# Patient Record
Sex: Female | Born: 1965 | Hispanic: No | Marital: Married | Smoking: Light tobacco smoker
Health system: Southern US, Community
[De-identification: ages and names within clinical notes are randomized; demographics above are authoritative.]

## PROBLEM LIST (undated history)

## (undated) DIAGNOSIS — K8020 Calculus of gallbladder without cholecystitis without obstruction: Secondary | ICD-10-CM

## (undated) HISTORY — PX: IUD REMOVAL: SHX5392

## (undated) HISTORY — PX: INTRAUTERINE DEVICE INSERTION: SHX323

## (undated) HISTORY — DX: Calculus of gallbladder without cholecystitis without obstruction: K80.20

---

## 1998-08-29 ENCOUNTER — Encounter: Admission: RE | Admit: 1998-08-29 | Discharge: 1998-08-29 | Payer: Self-pay | Admitting: *Deleted

## 2006-07-27 ENCOUNTER — Other Ambulatory Visit: Admission: RE | Admit: 2006-07-27 | Discharge: 2006-07-27 | Payer: Self-pay | Admitting: Obstetrics & Gynecology

## 2006-07-30 ENCOUNTER — Ambulatory Visit (HOSPITAL_COMMUNITY): Admission: RE | Admit: 2006-07-30 | Discharge: 2006-07-30 | Payer: Self-pay | Admitting: Obstetrics & Gynecology

## 2007-08-03 ENCOUNTER — Ambulatory Visit (HOSPITAL_COMMUNITY): Admission: RE | Admit: 2007-08-03 | Discharge: 2007-08-03 | Payer: Self-pay | Admitting: Obstetrics & Gynecology

## 2007-12-24 ENCOUNTER — Other Ambulatory Visit: Admission: RE | Admit: 2007-12-24 | Discharge: 2007-12-24 | Payer: Self-pay | Admitting: Obstetrics & Gynecology

## 2008-08-02 ENCOUNTER — Ambulatory Visit (HOSPITAL_COMMUNITY): Admission: RE | Admit: 2008-08-02 | Discharge: 2008-08-02 | Payer: Self-pay | Admitting: Obstetrics & Gynecology

## 2008-08-12 ENCOUNTER — Observation Stay (HOSPITAL_COMMUNITY): Admission: EM | Admit: 2008-08-12 | Discharge: 2008-08-14 | Payer: Self-pay | Admitting: Emergency Medicine

## 2008-12-29 ENCOUNTER — Other Ambulatory Visit: Admission: RE | Admit: 2008-12-29 | Discharge: 2008-12-29 | Payer: Self-pay | Admitting: Obstetrics & Gynecology

## 2008-12-30 ENCOUNTER — Inpatient Hospital Stay (HOSPITAL_COMMUNITY): Admission: EM | Admit: 2008-12-30 | Discharge: 2009-01-01 | Payer: Self-pay | Admitting: Emergency Medicine

## 2009-08-16 ENCOUNTER — Ambulatory Visit (HOSPITAL_COMMUNITY): Admission: RE | Admit: 2009-08-16 | Discharge: 2009-08-16 | Payer: Self-pay | Admitting: Obstetrics & Gynecology

## 2010-07-31 ENCOUNTER — Encounter: Admission: RE | Admit: 2010-07-31 | Discharge: 2010-07-31 | Payer: Self-pay | Admitting: Gastroenterology

## 2010-09-10 ENCOUNTER — Ambulatory Visit (HOSPITAL_COMMUNITY): Admission: RE | Admit: 2010-09-10 | Discharge: 2010-09-10 | Payer: Self-pay | Admitting: Obstetrics & Gynecology

## 2010-11-26 ENCOUNTER — Ambulatory Visit (HOSPITAL_COMMUNITY): Admission: RE | Admit: 2010-11-26 | Payer: Self-pay | Source: Home / Self Care | Admitting: Obstetrics & Gynecology

## 2011-01-15 HISTORY — PX: CHOLECYSTECTOMY: SHX55

## 2011-01-31 ENCOUNTER — Other Ambulatory Visit (HOSPITAL_COMMUNITY): Payer: BC Managed Care – PPO

## 2011-02-03 ENCOUNTER — Encounter (HOSPITAL_COMMUNITY)
Admission: RE | Admit: 2011-02-03 | Discharge: 2011-02-03 | Disposition: A | Discharge status: Home / Self Care | Payer: BC Managed Care – PPO | Source: Ambulatory Visit | Attending: General Surgery | Admitting: General Surgery

## 2011-02-03 DIAGNOSIS — Z01812 Encounter for preprocedural laboratory examination: Secondary | ICD-10-CM | POA: Insufficient documentation

## 2011-02-03 LAB — CBC
HCT: 44.5 % (ref 36.0–46.0)
MCH: 29.1 pg (ref 26.0–34.0)
MCHC: 33.3 g/dL (ref 30.0–36.0)
MCV: 87.6 fL (ref 78.0–100.0)
RDW: 13.6 % (ref 11.5–15.5)
WBC: 8.6 10*3/uL (ref 4.0–10.5)

## 2011-02-03 LAB — DIFFERENTIAL
Basophils Absolute: 0 10*3/uL (ref 0.0–0.1)
Basophils Relative: 0 % (ref 0–1)
Eosinophils Absolute: 0.2 10*3/uL (ref 0.0–0.7)
Eosinophils Relative: 2 % (ref 0–5)
Lymphocytes Relative: 33 % (ref 12–46)
Lymphs Abs: 2.9 10*3/uL (ref 0.7–4.0)
Monocytes Absolute: 0.4 10*3/uL (ref 0.1–1.0)
Monocytes Relative: 5 % (ref 3–12)
Neutro Abs: 5.2 10*3/uL (ref 1.7–7.7)
Neutrophils Relative %: 60 % (ref 43–77)

## 2011-02-03 LAB — COMPREHENSIVE METABOLIC PANEL
Albumin: 3.7 g/dL (ref 3.5–5.2)
BUN: 12 mg/dL (ref 6–23)
Potassium: 4.1 mEq/L (ref 3.5–5.1)
Sodium: 139 mEq/L (ref 135–145)

## 2011-02-03 LAB — SURGICAL PCR SCREEN: MRSA, PCR: NEGATIVE

## 2011-02-04 ENCOUNTER — Ambulatory Visit (HOSPITAL_COMMUNITY)
Admission: RE | Admit: 2011-02-04 | Discharge: 2011-02-04 | Disposition: A | Discharge status: Home / Self Care | Payer: BC Managed Care – PPO | Source: Ambulatory Visit | Attending: General Surgery | Admitting: General Surgery

## 2011-02-04 ENCOUNTER — Other Ambulatory Visit: Payer: Self-pay | Admitting: General Surgery

## 2011-02-04 DIAGNOSIS — Z01812 Encounter for preprocedural laboratory examination: Secondary | ICD-10-CM | POA: Insufficient documentation

## 2011-02-04 DIAGNOSIS — K8020 Calculus of gallbladder without cholecystitis without obstruction: Secondary | ICD-10-CM | POA: Insufficient documentation

## 2011-02-04 DIAGNOSIS — K824 Cholesterolosis of gallbladder: Secondary | ICD-10-CM | POA: Insufficient documentation

## 2011-02-09 NOTE — Op Note (Signed)
Felicia Gill, DRENNING NO.:  [id number]  MEDICAL RECORD NO.:  [id number]           PATIENT TYPE:  O  LOCATION:  SDSC                         FACILITY:  [facility]  PHYSICIAN:  Cherylynn Ridges, M.D.    DATE OF BIRTH:  1966-06-30  DATE OF PROCEDURE:  02/04/2011 DATE OF DISCHARGE:                              OPERATIVE REPORT   PREOPERATIVE DIAGNOSES:  Gallbladder polyps and symptomatic cholelithiasis.  POSTOPERATIVE DIAGNOSES:  Gallbladder polyps and symptomatic cholelithiasis.  PROCEDURE:  Laparoscopic cholecystectomy.  SURGEON:  Cherylynn Ridges, MD  ASSISTANT:  None.  ANESTHESIA:  General endotracheal.  ESTIMATED BLOOD LOSS:  Less than 20 mL.  COMPLICATIONS:  None.  CONDITION:  Stable.  FINDINGS:  Normal anatomy, minimal acute inflammation.  INDICATIONS FOR OPERATION:  The patient is a 46 year old with a prior history of symptomatic gallstones and also recently developed gallbladder polyps on ultrasound, who now comes in for laparoscopic cholecystectomy.  OPERATION:  The patient was taken to the operating room and placed on the table in supine position.  After an adequate general endotracheal anesthetic was administered, she was prepped and draped in usual sterile manner exposing the midline and the entire abdomen.  A supraumbilical midline incision was made using #15 blade, about 1.5 cm long and taken down to the midline fascia.  We isolated the fascia at that level and then made an incision longitudinally using #15 blade.  We grabbed the edges with Kocher clamps and bluntly dissected down into the peritoneal cavity with a Kelly clamp as we tented up using a Kocher clamps.  Once we were in the peritoneal cavity, a pursestring suture of 0 Vicryl was passed around the fascial opening.  We then passed a Hasson cannula into the peritoneal cavity with minimal difficulty securing it in place with a pursestring suture.  Once this was done, carbon dioxide  gas was insufflated into the peritoneal cavity up to maximal intra-abdominal pressure of 50 mmHg. With the patient in reverse Trendelenburg, two right upper quadrant 5-mm cannulas and a subxiphoid 5-mm cannula passed under direct vision.  Once all cannulas were in place, we began the dissection.  We retracted the gallbladder towards the anterior abdominal wall and the right upper quadrant placing a second retractor on the infundibulum.  We isolated the peritoneum overlying the triangle of Calot and hepatoduodenal triangle.  We dissected out circumferentially the cystic duct and the cystic artery.  Once this was done and there was no evidence of any problems with their anatomy, we clipped the cystic duct triply, distally and proximally x1 and transected it.  The cystic artery was clipped proximally, distally, doubly and transected.  The patient had normal preoperative liver function tests.  The gallbladder was then dissected out of its bed with minimal difficulty.  We retrieved it from the supraumbilical site using a large grasper with minimal difficulty, no EndoCatch bag was necessary.  Once this was done, we inspected the bed where electrocautery was used to control minimal bleeding, irrigated with saline solution and aspirated all fluid and gas from above the liver.  All needle counts, sponge  counts, and instrument counts at this time were correct.  We removed all cannulas tying off the fascia to supraumbilical site using a pursestring suture, which was in place.  We injected all sites with 0.25% Marcaine with epinephrine.  We closed the supraumbilical skin site using running subcuticular stitch of 4-0 Monocryl.  The other sites were closed using Dermabond, Steri-Strips, Tegaderm that was used also to complete the supraumbilical site.  All needle counts, sponge counts, and instrument counts were correct on the final count.  It should be mentioned that a proper time-out was done at  the beginning of the case, identifying the patient and the procedure to be performed prior to our first incision.     Cherylynn Ridges, M.D.     JOW/MEDQ  D:  02/04/2011  T:  02/04/2011  Job:  [id number]  cc:   Anselmo Rod, MD, [facility]  Electronically Signed by Jimmye Norman M.D. on 02/09/2011 07:33:20 AM

## 2011-03-31 LAB — DIFFERENTIAL
Basophils Absolute: 0 10*3/uL (ref 0.0–0.1)
Basophils Absolute: 0 K/uL (ref 0.0–0.1)
Basophils Relative: 0 % (ref 0–1)
Basophils Relative: 0 % (ref 0–1)
Eosinophils Absolute: 0 10*3/uL (ref 0.0–0.7)
Eosinophils Absolute: 0.3 K/uL (ref 0.0–0.7)
Eosinophils Relative: 0 % (ref 0–5)
Eosinophils Relative: 3 % (ref 0–5)
Lymphocytes Relative: 2 % — ABNORMAL LOW (ref 12–46)
Lymphocytes Relative: 31 % (ref 12–46)
Lymphs Abs: 0.4 10*3/uL — ABNORMAL LOW (ref 0.7–4.0)
Lymphs Abs: 3 K/uL (ref 0.7–4.0)
Monocytes Absolute: 0.2 10*3/uL (ref 0.1–1.0)
Monocytes Absolute: 0.7 10*3/uL (ref 0.1–1.0)
Monocytes Relative: 1 % — ABNORMAL LOW (ref 3–12)
Monocytes Relative: 7 % (ref 3–12)
Neutro Abs: 19.9 10*3/uL — ABNORMAL HIGH (ref 1.7–7.7)
Neutro Abs: 5.6 K/uL (ref 1.7–7.7)
Neutrophils Relative %: 59 % (ref 43–77)
Neutrophils Relative %: 97 % — ABNORMAL HIGH (ref 43–77)

## 2011-03-31 LAB — COMPREHENSIVE METABOLIC PANEL
ALT: 17 U/L (ref 0–35)
AST: 22 U/L (ref 0–37)
Alkaline Phosphatase: 75 U/L (ref 39–117)
CO2: 22 mEq/L (ref 19–32)
Chloride: 102 mEq/L (ref 96–112)
Creatinine, Ser: 1.04 mg/dL (ref 0.4–1.2)
GFR calc Af Amer: >60 mL/min (ref >60)
GFR calc non Af Amer: 58 mL/min — ABNORMAL LOW (ref >60)
Potassium: 3.5 mEq/L (ref 3.5–5.1)
Sodium: 136 mEq/L (ref 135–145)
Total Bilirubin: 0.9 mg/dL (ref 0.3–1.2)

## 2011-03-31 LAB — CBC
HCT: 27.6 % — ABNORMAL LOW (ref 36.0–46.0)
HCT: 38.6 % (ref 36.0–46.0)
HCT: 40.1 % (ref 36.0–46.0)
Hemoglobin: 12.7 g/dL (ref 12.0–15.0)
Hemoglobin: 13.3 g/dL (ref 12.0–15.0)
Hemoglobin: 9 g/dL — ABNORMAL LOW (ref 12.0–15.0)
MCHC: 32.8 g/dL (ref 30.0–36.0)
MCHC: 32.8 g/dL (ref 30.0–36.0)
MCHC: 33.1 g/dL (ref 30.0–36.0)
MCV: 85.3 fL (ref 78.0–100.0)
MCV: 88.7 fL (ref 78.0–100.0)
MCV: 91.1 fL (ref 78.0–100.0)
Platelets: 171 10*3/uL (ref 150–400)
Platelets: 177 10*3/uL (ref 150–400)
Platelets: 208 10*3/uL (ref 150–400)
Platelets: 315 10*3/uL (ref 150–400)
RBC: 3.23 MIL/uL — ABNORMAL LOW (ref 3.87–5.11)
RBC: 4.52 MIL/uL (ref 3.87–5.11)
RDW: 13.4 % (ref 11.5–15.5)
RDW: 13.6 % (ref 11.5–15.5)
RDW: 13.7 % (ref 11.5–15.5)
RDW: 17.3 % — ABNORMAL HIGH (ref 11.5–15.5)
WBC: 14.1 10*3/uL — ABNORMAL HIGH (ref 4.0–10.5)
WBC: 20.5 K/uL — ABNORMAL HIGH (ref 4.0–10.5)
WBC: 9.5 K/uL (ref 4.0–10.5)

## 2011-03-31 LAB — LIPID PANEL
Cholesterol: 129 mg/dL (ref 0–200)
HDL: 42 mg/dL (ref >39)
LDL Cholesterol: 80 mg/dL (ref 0–99)
Total CHOL/HDL Ratio: 3.1 RATIO
Triglycerides: 37 mg/dL (ref <150)
VLDL: 7 mg/dL (ref 0–40)

## 2011-03-31 LAB — BASIC METABOLIC PANEL
BUN: 6 mg/dL (ref 6–23)
BUN: 8 mg/dL (ref 6–23)
CO2: 26 mEq/L (ref 19–32)
Creatinine, Ser: 0.9 mg/dL (ref 0.4–1.2)
GFR calc non Af Amer: >60 mL/min (ref >60)
GFR calc non Af Amer: >60 mL/min (ref >60)
Glucose, Bld: 129 mg/dL — ABNORMAL HIGH (ref 70–99)
Glucose, Bld: 85 mg/dL (ref 70–99)
Potassium: 4 mEq/L (ref 3.5–5.1)

## 2011-03-31 LAB — URINALYSIS, ROUTINE W REFLEX MICROSCOPIC
Bilirubin Urine: NEGATIVE
Glucose, UA: NEGATIVE mg/dL
Ketones, ur: NEGATIVE mg/dL
Nitrite: NEGATIVE
Protein, ur: NEGATIVE mg/dL
Specific Gravity, Urine: 1.011 (ref 1.005–1.030)
Urobilinogen, UA: 0.2 mg/dL (ref 0.0–1.0)
pH: 6 (ref 5.0–8.0)

## 2011-03-31 LAB — URINE MICROSCOPIC-ADD ON

## 2011-03-31 LAB — POCT PREGNANCY, URINE: Preg Test, Ur: NEGATIVE

## 2011-03-31 LAB — CULTURE, BLOOD (ROUTINE X 2)
Culture: NO GROWTH
Culture: NO GROWTH

## 2011-03-31 LAB — URINE CULTURE
Colony Count: NO GROWTH
Culture: NO GROWTH

## 2011-04-29 NOTE — Discharge Summary (Signed)
NAMEJEFFIFER, Felicia Gill NO.:  [id number]   MEDICAL RECORD NO.:  [id number]          PATIENT TYPE:  INP   LOCATION:  1225                         FACILITY:  [facility]   PHYSICIAN:  Theodosia Paling, MD    DATE OF BIRTH:  August 29, 1966   DATE OF ADMISSION:  12/30/2008  DATE OF DISCHARGE:  01/01/2009                               DISCHARGE SUMMARY   Admitting history, please refer to the excellent admission note dictated  by Dr. Della Goo.   PRIMARY CARE PHYSICIAN:  The patient is unassigned.   DISCHARGE DIAGNOSES:  1. Most likely allergic reaction to medication Bactrim.  2. Transient hypertension.  3. Folliculitis.   DISCHARGE MEDICATIONS:  None.   HOSPITAL COURSE:  The following issues were addressed during the  hospitalization:   1. Allergic reaction to BACTRIM according to the patient.  As an      outpatient she took 1 dose of BACTRIM, and within a couple of hours      she had fever, chills, nausea, vomiting, back pains, myalgia, and      headache.  Later on she presented to the ER where she was found to      have leukocytosis, and she was admitted for possible MRSA      infection.  However, the symptoms appeared more likely secondary to      BACTRIM allergy than anything.  The patient's symptoms      significantly improved within 24 hours after stopping the drug.  2. Transient hypertension.  The patient had transient hypertension.      She received IV fluids along with dopamine. Within 12 hours her      hypertension resolved.  At the time of discharge she was      hemodynamically stable.  3. Folliculitis.  The patient and her husband have history of      folliculitis and boils so at the time of admission and discharge      the patient had evidence of folliculitis which has healed already.      At this time she has no fever or evidence of skin infection with      Staphylococcus.  However, she and her husband may be the carriers      of that.  Therefore as  an outpatient she was going to get the      nasal swab.  If both she and her husband are positive or even if      she is positive for that, she and her husband will need MRSA      decontamination with chlorhexidine body wash, Mupirocin nasal      ointment, and rifampin or doxycycline for 6-7 days.   CONSULTATIONS PERFORMED:  None.   PROCEDURE PERFORMED:  None.   IMAGING PERFORMED:  Chest x-ray done on 16th of January 2010 showed no  acute active cardiopulmonary process.  CT scan of the abdomen and pelvis  without contrast shows no acute abdominal or pelvic pathology.   DISPOSITION:  The patient is to follow with her OB/GYN who has  previously diagnosed her with  MRSA.  If she does have a definite  diagnosis of MRSA which I do not have any evidence of, she is going to  need decontamination along with her husband.  She is clearly instructed  about this, and is given appropriate literature.   Total time spent in discharge of the patient 45 minutes.      Theodosia Paling, MD  Electronically Signed     NP/MEDQ  D:  01/01/2009  T:  01/01/2009  Job:  [id number]

## 2011-04-29 NOTE — Discharge Summary (Signed)
Felicia Gill, Felicia Gill                  ACCOUNT NO.:  [id number]   MEDICAL RECORD NO.:  [id number]          PATIENT TYPE:  INP   LOCATION:  1509                         FACILITY:  [facility]   PHYSICIAN:  Michelene Gardener, MD    DATE OF BIRTH:  [birth year]   DATE OF ADMISSION:  08/12/2008  DATE OF DISCHARGE:  08/14/2008                               DISCHARGE SUMMARY   DISCHARGE DIAGNOSES:  1. Acute cholecystitis.  2. Cholelithiasis.  3. Elevated liver function test.  4. Hypotension baseline is 95-110.  5. Right ovary and hemorrhagic cyst.  6. Thrombocytopenia.   DISCHARGE MEDICATIONS:  1. Avelox 400 mg p.o. once a day times 10 days.  2. Ultram 50 mg p.o. q.4 h as needed.  3. Phenergan 25 mg p.o. q.4 h as needed.   CONSULTATIONS:  Surgical consult with Dr. Baruch Merl.   PROCEDURES:  None.   RADIOLOGY STUDIES:  1. CT scan of the abdomen with contrast on August 29 showed no acute      findings.  2. CT scan of the pelvis with contrast on August 29 showed possible      right hydrosalpinx.  3. Abdominal ultrasound one August 31 showed cholelithiasis with      possible acute cholecystitis.  4. Abdominal ultrasound of the pelvis showed cystic structure in the      right ovary, most likely represents hemorrhagic ovarian cyst   FOLLOW UP:  1. Dr. Baruch Merl within one week.  2. Dr. Della Goo within 1-2 weeks.   COURSE OF HOSPITALIZATION:  1. Cholelithiasis/cholecystitis.  This patient has been having      abdominal pain, nausea, vomiting and fever and chills for a few      days prior to her hospitalization.  The patient was treated with      oral antibiotics without improvement.  She was admitted to the      hospital for further evaluation.  The patient was initially kept      n.p.o., was started on IV antibiotics and also started on      antiemetics and IV fluids.  The patient improved quickly during the      hospital.  She remained afebrile.  White count remained normal.     She was started on clear liquid diet and then she was advanced to      soft diet.  At the time of discharge, she had mild nausea but there      was no vomiting.  She does not have fever.  She she had minimal      abdominal pain.  CT scan of the abdomen and pelvis were done and      results were mentioned above.  Ultrasound of the abdomen was done      and showed cholelithiasis with possible cholecystitis.  I consulted      with Dr. Baruch Merl. We have discussion over the phone.  Dr      Colin Benton recommended to discharge the patient home on oral antibiotics      and to have her follow with him  by the end of the week for      evaluation of a possible laparoscopic cholecystectomy.  Findings      were discussed with this patient who felt fine and asymptomatic and      she stated that she will follow with him.  She was advised to come      to the ER if her symptoms worsened.  She was given antibiotics for      10 days in addition to pain medications and antiemetics.  2. Elevated LFTs that were questionable for hepatitis.  Liver      ultrasound did not show any evidence of chronic liver change.      Hepatitis profile including hepatitis A, B and C were sent and      results are pending at this point.  The patient will follow with      Dr. Lovell Sheehan as an outpatient for that.  3. Hypotension.  Blood pressure remained in the range of 400-110 and      that apparently at her baseline, and she remains asymptomatic with      that.  4. Right ovarian hemorrhagic cyst that was seen in her initial CAT      scan.  Ultrasound showed this finding again.  Recommendations were      to repeat her pelvic ultrasound in 6 weeks and further plans will      depend on the results.  5. Thrombocytopenia.  The patient does not have bleeding.  She will be      followed as an outpatient for that.   TOTAL ASSESSMENT TIME:  Forty minutes.      Michelene Gardener, MD  Electronically Signed     NAE/MEDQ  D:   08/14/2008  T:  08/14/2008  Job:  [id number]

## 2011-04-29 NOTE — H&P (Signed)
Felicia Gill, Felicia Gill NO.:  [id number]   MEDICAL RECORD NO.:  [id number]          PATIENT TYPE:  INP   LOCATION:  0102                         FACILITY:  [facility]   PHYSICIAN:  Michelene Gardener, MD    DATE OF BIRTH:  [birth year]   DATE OF ADMISSION:  08/11/2008  DATE OF DISCHARGE:                              HISTORY & PHYSICAL   PRIMARY PHYSICIAN:  Patient followed at [facility] and will be admitted  as unassigned.   CHIEF COMPLAINT:  Increasing nausea, vomiting, fever and dizziness.   HISTORY OF PRESENT ILLNESS:  This is a 45 year old Asian female with no  significant past medical history, presented with the above-mentioned  complaint.  Condition started last Friday around 8 days ago with nausea,  vomiting, headache, fever.  The patient saw her doctor on Monday and she  was prescribed Bactrim and Phenergan, that helped a little and then her  symptoms started to worsen.  Her temperature at home can go as high as  103-104.  She continues to have nausea and she has been vomiting every  day.  That has been associated with headaches.  Sometimes when she gets  up she become dizzy.  Came into the ER for further evaluation.  In the  ER her blood pressure has been persistent in the low side.  Her T-max  was 102.1.  CT scan of the abdomen and pelvis was done and it showed  only right hydrosalpinx.  Liver enzymes were elevated.  The Hospitalist  service was called for further evaluation.   PAST MEDICAL HISTORY:  denied.   PAST SURGICAL HISTORY:  Denied.   ALLERGIES:  NO KNOWN DRUG ALLERGIES.   CURRENT MEDICATIONS:  Bactrim, Phenergan that was prescribed last Monday  for her current symptoms.   SOCIAL HISTORY:  She denies smoking, denies alcohol drinking and denied  recreational drugs.   FAMILY HISTORY:  No intestinal problems, no diabetes, no hypertension or  no coronary artery disease.   REVIEW OF SYSTEMS:  12 point system were reviewed and they were negative  except as per HPI.   PHYSICAL EXAMINATION:  VITAL SIGNS:  Last set of vital signs showed  temperature of 98.1, blood pressure 85/51, pulse 70, respiratory rate  16.  GENERAL APPEARANCE:  This is middle-aged Asian female not in acute  distress.  HEENT: Her conjunctivae are pink; pupils are equal, round, reactive to  light; there is no ptosis, hearing is intact, there is no ear discharge  or infection.  There is no nose infection or bleeding.  Oral mucosa dry.  No pharyngeal erythema.  NECK:  Supple, no JVD, no carotid bruit, no thyroid enlargement.  CARDIOVASCULAR:  S1-S2 regular, there is no murmurs, there is no gallops  and there is no thrills.  RESPIRATORY:  Patient is breathing between 16 to 18; there is no rales,  no rhonchi, no wheeze.  ABDOMEN:  Soft , nondistended, there is mild tenderness, there is no  hepatosplenomegaly, bowel sounds are present.  LOWER EXTREMITIES:  No edema, no rash and no varicose veins.  SKIN:  No rash, no erythema.  NEURO:  Cranial nerves are intact II to XII.  There is no motor or  sensory deficits.   LAB RESULTS:  Sodium 134, potassium 3.8, chloride 102, bicarb 8,  creatinine 1.2, glucose 129, calcium 1.11, CO2 23, WBCs 3, hemoglobin  13.1, hematocrit 39, MCV 87.8, platelet count is 106.  Urinalysis is  negative.  CT scan of the abdomen and pelvis showed findings suggestive  of right hydrosalpinx.   IMPRESSION:  1. Intractable nausea and vomiting.  2. Elevated liver enzymes.  3. Right hydrosalpinx  4. Hypotension.   PLAN:  This lady is a young lady who has been having symptoms of fever,  nausea, vomiting, headaches and dizziness.  Those symptoms has been  going on for around a week.  She was evaluated by her primary doctor and  she was started on Bactrim and Phenergan without much improvement.  Came  into the ER because her symptoms were worsening.  Initial workup in the  ER showed fever with hypotension and elevated liver enzymes.  I will   admit her to the floor.  Will start her on clear liquid diet and then  will try to advance as tolerated.  I will put her on normal saline at  150 mL an hour.  Will start her on Zofran as needed for nausea.  Her  liver enzymes are elevated and those symptoms might be suggestive of  hepatitis.  Will get hepatitis profile including hepatitis A, B and C.  I will also get abdominal ultrasound to evaluate her liver.  Will get PT  and INR as well.  Her CT scan of the abdomen and pelvis is suggestive of  right hydrosalpinx and I will do pelvic ultrasound for more evaluation.  I will also follow her blood pressure very closely since it has been on  the low side and I am expecting this improved with IV fluids.   Assessment time is 50 minutes thank you      Michelene Gardener, MD  Electronically Signed     NAE/MEDQ  D:  08/12/2008  T:  08/12/2008  Job:  [id number]

## 2011-04-29 NOTE — H&P (Signed)
Felicia, Gill NO.:  [id number]   MEDICAL RECORD NO.:  [id number]          PATIENT TYPE:  INP   LOCATION:  0101                         FACILITY:  [facility]   PHYSICIAN:  Vania Rea, M.D. DATE OF BIRTH:  [birth year]   DATE OF ADMISSION:  12/30/2008  DATE OF DISCHARGE:                              HISTORY & PHYSICAL   PRIMARY CARE PHYSICIAN:  Dr. Della Goo.   CHIEF COMPLAINT:  Fever, chills, myalgias, nausea, vomiting since last  night.   HISTORY OF PRESENT ILLNESS:  This is a 45 year old Asian lady from  [place] who visited her gynecologist yesterday for an annual checkup and  was noted to have healing boils on her skin, diagnosed with MRSA and  prescribed Bactrim.  The patient took 1 dose of the Bactrim yesterday  evening and within a couple of hours developed sudden onset of fever,  chills, nausea, vomiting, back pains, myalgias, headaches.  These  symptoms were exactly the same symptoms that the patient when she  started taking Bactrim in August last year.  At that time, when she was  admitted to the hospital with these symptoms, she was evaluated and  found to have a possible cholecystitis.  Also had abnormal liver  function tests.  Hepatitis A antibodies were positive.  The patient was  felt to have had an episode of acute cholecystitis and eventually  discharged home in stable condition on Avelox to follow up with a  surgeon for cholecystectomy.  The patient has not yet had  cholecystectomy.   The patient visited [place] over Christmas and returned from [place] on  Christmas Eve.  She had no unusual events in [place].  Malaria is  endemic in [place]; the patient said she has never had malaria.   The patient works in the school system.  Her husband works in a homeless  shelter, and they both suffer from recurrent staphylococcal-like boils.  Her boyfriend does also appear to have been prescribed Bactrim and had  his boils drained.  Her  boils almost completely resolved spontaneously.   The patient denies neck stiffness.  She denies abdominal pain.  She does  report that she has a Candida discharge for which she is about to start  treatment with vaginal cream.   PAST MEDICAL HISTORY:  1. History of acute cholecystitis.  2. History of vaginal yeast infection.   MEDICATIONS:  Bactrim 1 dose taken.   ALLERGIES:  No known drug allergies, but the patient notes that she had  an exact same reaction in August after taking Bactrim.   SOCIAL HISTORY:  She denies tobacco, alcohol or illicit drug use.  She  was originally born in [place], living here with her husband.  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  Her  husband does work in homeless shelters.   FAMILY HISTORY:  Significant only for some type of disorder that her  father has, and he has to take a blood thinner to protect his heart.   REVIEW OF SYSTEMS:  On a 10-point review of systems, other  than noted  above, unremarkable.   PHYSICAL EXAMINATION:  Pleasant young Asian lady lying in bed, looks  toxic.  VITALS:  Temperature is 101 rectal.  Her pulse is 116, respiration 20,  blood pressure 103/50.  She is saturating at 96% on room air.  Pupils are round, equal and reactive.  Mucous membranes are pink.  She  has clear nasal discharge.  Her throat has mucus and is inflamed.  No  cervical lymphadenopathy or thyromegaly.  No jugular venous distention.  Her chest is clear to auscultation bilaterally.  CARDIOVASCULAR SYSTEM:  Tachycardiac.  Her abdomen is soft; no deep tenderness.  EXTREMITIES:  Without edema.  She has 2+ dorsalis pedis pulses bilaterally.  On her left thigh and  left buttock, she has almost completely healed remnant of a boil,  probably a staphylococcal boil.  CENTRAL NERVOUS SYSTEM:  Cranial nerves II-XII are grossly intact, and  she has no focal neurologic deficit.   LABORATORY DATA:  Her white count is 20.5,  hemoglobin 13.3, platelets  208.  She has 97% neutrophils, absolute granulocyte count 19.9.  She has  vacuolated neutrophils.  Chest x-ray and CT scans of abdomen and pelvis  revealed no acute findings.  Specifically, her spleen is not enlarged.   ASSESSMENT:  Probably acute infectious process in view of elevated white  count with vacuolated neutrophilia.  It is possibly MRSA sepsis.  Malaria is less likely in view of the nasal and throat congestion and  rhinorrhea.  An acute allergy to Bactrim is also possible since she had  exactly the same symptoms previously.  Overall, symptoms are more of  those of a viral sickness.  However, the marked neutrophilia is unusual.   PLAN:  Will admit this lady for symptomatic treatment.  Will give her  Claritin and will also add intravenous vancomycin and Bactroban to her  nares.  Will check her liver function tests and will consider an  infectious disease consult.  Other plans as per orders.      Vania Rea, M.D.  Electronically Signed     LC/MEDQ  D:  12/30/2008  T:  12/30/2008  Job:  [id number]   cc:   Della Goo, M.D.  Fax: [phone]

## 2011-10-10 ENCOUNTER — Other Ambulatory Visit: Payer: Self-pay | Admitting: Obstetrics & Gynecology

## 2011-10-10 DIAGNOSIS — Z1231 Encounter for screening mammogram for malignant neoplasm of breast: Secondary | ICD-10-CM

## 2011-11-05 ENCOUNTER — Ambulatory Visit (HOSPITAL_COMMUNITY)
Admission: RE | Admit: 2011-11-05 | Discharge: 2011-11-05 | Disposition: A | Discharge status: Home / Self Care | Payer: BC Managed Care – PPO | Source: Ambulatory Visit | Attending: Obstetrics & Gynecology | Admitting: Obstetrics & Gynecology

## 2011-11-05 DIAGNOSIS — Z1231 Encounter for screening mammogram for malignant neoplasm of breast: Secondary | ICD-10-CM | POA: Insufficient documentation

## 2012-10-28 ENCOUNTER — Other Ambulatory Visit: Payer: Self-pay | Admitting: Obstetrics & Gynecology

## 2012-10-28 DIAGNOSIS — Z1231 Encounter for screening mammogram for malignant neoplasm of breast: Secondary | ICD-10-CM

## 2012-11-12 ENCOUNTER — Ambulatory Visit (HOSPITAL_COMMUNITY)
Admission: RE | Admit: 2012-11-12 | Discharge: 2012-11-12 | Disposition: A | Discharge status: Home / Self Care | Payer: BC Managed Care – PPO | Source: Ambulatory Visit | Attending: Obstetrics & Gynecology | Admitting: Obstetrics & Gynecology

## 2012-11-12 DIAGNOSIS — Z1231 Encounter for screening mammogram for malignant neoplasm of breast: Secondary | ICD-10-CM | POA: Insufficient documentation

## 2013-06-21 ENCOUNTER — Other Ambulatory Visit: Payer: Self-pay | Admitting: Geriatric Medicine

## 2013-06-27 ENCOUNTER — Encounter: Payer: Self-pay | Admitting: Certified Nurse Midwife

## 2013-06-28 ENCOUNTER — Encounter: Payer: Self-pay | Admitting: Certified Nurse Midwife

## 2013-06-28 ENCOUNTER — Ambulatory Visit (INDEPENDENT_AMBULATORY_CARE_PROVIDER_SITE_OTHER): Payer: BC Managed Care – PPO | Admitting: Certified Nurse Midwife

## 2013-06-28 VITALS — BP 110/64 | HR 64 | Temp 98.2°F | Resp 16 | Ht 62.25 in | Wt 167.0 lb

## 2013-06-28 DIAGNOSIS — N390 Urinary tract infection, site not specified: Secondary | ICD-10-CM

## 2013-06-28 LAB — POCT URINALYSIS DIPSTICK
Glucose, UA: NEGATIVE
Nitrite, UA: POSITIVE
Protein, UA: NEGATIVE
Urobilinogen, UA: NEGATIVE
pH, UA: 5

## 2013-06-28 MED ORDER — NITROFURANTOIN MONOHYD MACRO 100 MG PO CAPS: 100.0000 mg | ORAL_CAPSULE | Freq: Two times a day (BID) | ORAL | Status: DC

## 2013-06-28 NOTE — Progress Notes (Signed)
47 y.o.Felicia Gill female [phone] with a 2 week(s) history of the following:burning and urinary symptoms of urinary frequency, urinary hesitancy and urinary urgency Sexually active: yes Last sexual activity:20days ago. Pt also reports the following associated symptoms: headache and fatigue, denies fever or chills. Patient has tried over the counter treatment with minimal relief. Contraception, Mirena IUD  O: Healthy female WDWN Affect: normal, orientation x 3 CVAT negative bilateral Abdomen: positive suprapubic tenderness    Exam:  JYN:WGNFAO, urethra, urethral meatus and bladder all tender                Vag:no lesions, normal vaginal discharge                Cx:  normal appearance and non tender ,IUD string not seen(noted per [place] in place at aex due to not seeing string)                Uterus:normal size, non-tender, normal shape and consistency, anteflexed                Adnexa: normal adnexa and no mass, fullness, tenderness   Poct urine- rbc trace, wbc 1+, nitrite +  A: UTI  P:Reviewed findings,discussed importance of water intake, decrease coffee and tea. Warning signs and symptoms given for UTI. ZHY:QMVHQ culture, micro Rx Macrobid see order  TOC 2 weeks if culture positive.  Rv prn/as above  Reviewed, TL

## 2013-06-29 LAB — URINALYSIS, MICROSCOPIC ONLY
Bacteria, UA: NONE SEEN
Casts: NONE SEEN
Crystals: NONE SEEN

## 2013-06-30 LAB — URINE CULTURE: Colony Count: >=100000

## 2013-07-05 NOTE — Progress Notes (Signed)
Agreeable to plan.

## 2013-07-06 ENCOUNTER — Ambulatory Visit: Payer: Self-pay | Admitting: Obstetrics & Gynecology

## 2013-07-08 ENCOUNTER — Encounter: Payer: Self-pay | Admitting: Obstetrics & Gynecology

## 2013-07-08 ENCOUNTER — Ambulatory Visit (INDEPENDENT_AMBULATORY_CARE_PROVIDER_SITE_OTHER): Payer: BC Managed Care – PPO | Admitting: Obstetrics & Gynecology

## 2013-07-08 VITALS — BP 100/66 | HR 64 | Resp 16 | Ht 62.25 in | Wt 166.0 lb

## 2013-07-08 DIAGNOSIS — N390 Urinary tract infection, site not specified: Secondary | ICD-10-CM

## 2013-07-08 DIAGNOSIS — Z0000 Encounter for general adult medical examination without abnormal findings: Secondary | ICD-10-CM

## 2013-07-08 DIAGNOSIS — Z01419 Encounter for gynecological examination (general) (routine) without abnormal findings: Secondary | ICD-10-CM

## 2013-07-08 LAB — COMPREHENSIVE METABOLIC PANEL
ALT: 17 U/L (ref 0–35)
Albumin: 4.1 g/dL (ref 3.5–5.2)
CO2: 29 mEq/L (ref 19–32)
Glucose, Bld: 83 mg/dL (ref 70–99)
Potassium: 4.8 mEq/L (ref 3.5–5.3)
Sodium: 139 mEq/L (ref 135–145)
Total Bilirubin: 0.4 mg/dL (ref 0.3–1.2)
Total Protein: 7.7 g/dL (ref 6.0–8.3)

## 2013-07-08 LAB — HEMOGLOBIN, FINGERSTICK: Hemoglobin, fingerstick: 14.9 g/dL (ref 12.0–16.0)

## 2013-07-08 LAB — LIPID PANEL
Cholesterol: 164 mg/dL (ref 0–200)
LDL Cholesterol: 113 mg/dL — ABNORMAL HIGH (ref 0–99)
VLDL: 11 mg/dL (ref 0–40)

## 2013-07-08 LAB — TSH: TSH: 2.528 u[IU]/mL (ref 0.350–4.500)

## 2013-07-08 NOTE — Progress Notes (Addendum)
Patient ID: Felicia Gill, female   DOB: 08-17-1966, 47 y.o.   MRN: [id number]  47 y.o. [id number] MarriedCaucasianF here for annual exam.  Has one more year in school left.  Not having any bleeding.  Had UTI last week.  Needs repeat culture.    Patient's last menstrual period was 04/14/2013.          Sexually active: yes  The current method of family planning is IUD.    Exercising: yes  walking Smoker:  yes  Health Maintenance: Pap:  06/04/12, WNL, neg HR HPV History of abnormal Pap:  no MMG:  11/02/12, BIRADS 1: NEGATIVE Colonoscopy:  never BMD:   never TDaP:  06/04/10 Screening Labs:6/11--here, Hb today: 14.9, Urine today: trace leuks, pH 5.0   reports that she has been smoking.  She does not have any smokeless tobacco history on file. She reports that she does not drink alcohol or use illicit drugs.  Past Medical History  Diagnosis Date  . Gallstone     Past Surgical History  Procedure Laterality Date  . Iud removal      removal of paragard 10/31/10  . Intrauterine device insertion      mirena insertion 12/25/10  . Cholecystectomy  2/12    secondary to gallbladder polyp    Current Outpatient Prescriptions  Medication Sig Dispense Refill  . levonorgestrel (MIRENA) 20 MCG/24HR IUD 1 each by Intrauterine route once.      . nitrofurantoin, macrocrystal-monohydrate, (MACROBID) 100 MG capsule Take 1 capsule (100 mg total) by mouth 2 (two) times daily.  14 capsule  0   No current facility-administered medications for this visit.    Family History  Problem Relation Age of Onset  . Hypertension Mother   . Diabetes Father   . Diabetes Paternal Grandmother     ROS:  Pertinent items are noted in HPI.  Otherwise, a comprehensive ROS was negative.  Exam:   BP 100/66  Pulse 64  Resp 16  Ht 5' 2.25" (1.581 m)  Wt 166 lb (75.297 kg)  BMI 30.12 kg/m2  LMP 04/14/2013  Weight change: stable  Height: 5' 2.25" (158.1 cm)  Ht Readings from Last 3 Encounters:  07/08/13 5' 2.25"  (1.581 m)  06/28/13 5' 2.25" (1.581 m)    General appearance: alert, cooperative and appears stated age Head: Normocephalic, without obvious abnormality, atraumatic Neck: no adenopathy, supple, symmetrical, trachea midline and thyroid normal to inspection and palpation Lungs: clear to auscultation bilaterally Breasts: normal appearance, no masses or tenderness Heart: regular rate and rhythm Abdomen: soft, non-tender; bowel sounds normal; no masses,  no organomegaly Extremities: extremities normal, atraumatic, no cyanosis or edema Skin: Skin color, texture, turgor normal. No rashes or lesions Lymph nodes: Cervical, supraclavicular, and axillary nodes normal. No abnormal inguinal nodes palpated Neurologic: Grossly normal   Pelvic: External genitalia:  no lesions              Urethra:  normal appearing urethra with no masses, tenderness or lesions              Bartholins and Skenes: normal                 Vagina: normal appearing vagina with normal color and discharge, no lesions              Cervix: no lesions, string noted              Pap taken: no Bimanual Exam:  Uterus:  normal size, contour, position,  consistency, mobility, non-tender              Adnexa: normal adnexa and no mass, fullness, tenderness               Rectovaginal: Confirms               Anus:  normal sphincter tone, no lesions  A:  Well Woman with normal exam mirena IUD placed 12/25/10, amenorrhea with IUD E. Coli UTI last week  P:   mammogram pap smear with neg HR HPV 6/13 Urine culture today return annually or prn  An After Visit Summary was printed and given to the patient.

## 2013-07-08 NOTE — Patient Instructions (Signed)

## 2013-07-10 LAB — URINE CULTURE

## 2013-07-11 ENCOUNTER — Telehealth: Payer: Self-pay

## 2013-07-11 NOTE — Telephone Encounter (Signed)
7/28 lmtcb/kn

## 2013-07-11 NOTE — Telephone Encounter (Signed)
Message copied by Elisha Headland on Mon Jul 11, 2013  9:08 AM ------      Message from: Jerene Bears      Created: Sun Jul 10, 2013  5:10 PM       Inform urine culture negative.  TSH nl.  CMP nl.  Lipids show total 164.  LDL 113.  Recheck 2 years. ------

## 2013-07-25 NOTE — Telephone Encounter (Signed)
Returning your call. °

## 2013-08-25 ENCOUNTER — Encounter: Payer: Self-pay | Admitting: Obstetrics & Gynecology

## 2013-09-15 ENCOUNTER — Ambulatory Visit: Payer: BC Managed Care – PPO | Admitting: Obstetrics & Gynecology

## 2013-09-30 ENCOUNTER — Ambulatory Visit (INDEPENDENT_AMBULATORY_CARE_PROVIDER_SITE_OTHER): Payer: BC Managed Care – PPO | Admitting: Obstetrics & Gynecology

## 2013-09-30 ENCOUNTER — Encounter: Payer: Self-pay | Admitting: Obstetrics & Gynecology

## 2013-09-30 VITALS — BP 108/68 | HR 60 | Resp 16 | Ht 62.0 in | Wt 166.0 lb

## 2013-09-30 DIAGNOSIS — N898 Other specified noninflammatory disorders of vagina: Secondary | ICD-10-CM

## 2013-09-30 LAB — POCT URINALYSIS DIPSTICK
Glucose, UA: NEGATIVE
Nitrite, UA: NEGATIVE
Urobilinogen, UA: NEGATIVE
pH, UA: 5

## 2013-09-30 MED ORDER — METRONIDAZOLE 0.75 % VA GEL: 1.0000 | Freq: Every day | VAGINAL | Status: DC

## 2013-09-30 NOTE — Patient Instructions (Signed)
Bacterial Vaginosis Bacterial vaginosis (BV) is a vaginal infection where the normal balance of bacteria in the vagina is disrupted. The normal balance is then replaced by an overgrowth of certain bacteria. There are several different kinds of bacteria that can cause BV. BV is the most common vaginal infection in women of childbearing age. CAUSES   The cause of BV is not fully understood. BV develops when there is an increase or imbalance of harmful bacteria.  Some activities or behaviors can upset the normal balance of bacteria in the vagina and put women at increased risk including:  Having a new sex partner or multiple sex partners.  Douching.  Using an intrauterine device (IUD) for contraception.  It is not clear what role sexual activity plays in the development of BV. However, women that have never had sexual intercourse are rarely infected with BV. Women do not get BV from toilet seats, bedding, swimming pools or from touching objects around them.  SYMPTOMS   Grey vaginal discharge.  A fish-like odor with discharge, especially after sexual intercourse.  Itching or burning of the vagina and vulva.  Burning or pain with urination.  Some women have no signs or symptoms at all. DIAGNOSIS  Your caregiver must examine the vagina for signs of BV. Your caregiver will perform lab tests and look at the sample of vaginal fluid through a microscope. They will look for bacteria and abnormal cells (clue cells), a pH test higher than 4.5, and a positive amine test all associated with BV.  RISKS AND COMPLICATIONS   Pelvic inflammatory disease (PID).  Infections following gynecology surgery.  Developing HIV.  Developing herpes virus. TREATMENT  Sometimes BV will clear up without treatment. However, all women with symptoms of BV should be treated to avoid complications, especially if gynecology surgery is planned. Female partners generally do not need to be treated. However, BV may spread  between female sex partners so treatment is helpful in preventing a recurrence of BV.   BV may be treated with antibiotics. The antibiotics come in either pill or vaginal cream forms. Either can be used with nonpregnant or pregnant women, but the recommended dosages differ. These antibiotics are not harmful to the baby.  BV can recur after treatment. If this happens, a second round of antibiotics will often be prescribed.  Treatment is important for pregnant women. If not treated, BV can cause a premature delivery, especially for a pregnant woman who had a premature birth in the past. All pregnant women who have symptoms of BV should be checked and treated.  For chronic reoccurrence of BV, treatment with a type of prescribed gel vaginally twice a week is helpful. HOME CARE INSTRUCTIONS   Finish all medication as directed by your caregiver.  Do not have sex until treatment is completed.  Tell your sexual partner that you have a vaginal infection. They should see their caregiver and be treated if they have problems, such as a mild rash or itching.  Practice safe sex. Use condoms. Only have 1 sex partner. PREVENTION  Basic prevention steps can help reduce the risk of upsetting the natural balance of bacteria in the vagina and developing BV:  Do not have sexual intercourse (be abstinent).  Do not douche.  Use all of the medicine prescribed for treatment of BV, even if the signs and symptoms go away.  Tell your sex partner if you have BV. That way, they can be treated, if needed, to prevent reoccurrence. SEEK MEDICAL CARE IF:     Your symptoms are not improving after 3 days of treatment.  You have increased discharge, pain, or fever. MAKE SURE YOU:   Understand these instructions.  Will watch your condition.  Will get help right away if you are not doing well or get worse. FOR MORE INFORMATION  Division of STD Prevention (DSTDP), Centers for Disease Control and Prevention:  www.cdc.gov/std American Social Health Association (ASHA): www.ashastd.org  Document Released: 12/01/2005 Document Revised: 02/23/2012 Document Reviewed: 05/24/2009 Exit[employer] Patient Information 2014 [employer].  

## 2013-09-30 NOTE — Progress Notes (Signed)
47 y.o. [id number] MarriedAsianF here for annual exam but AEX was done in July.  Since here, patient reports still wants to be see as she is having some issues with discharge.  Patient reports some discharge yesterday before cycle started.  On cycle today.  Flow lasts 4 days.  Not heavy.  Light and spotty.  No new sexual partner.  Still in school.  One year left.  Will graduated in December 2015!!!  She is ready!  Patient's last menstrual period was 09/29/2013.          Sexually active: yes  The current method of family planning is IUD.    Exercising: yes  walking Smoker:  yes  Health Maintenance: Pap:  06/04/12 WNL/negative HR HPV History of abnormal Pap:  no MMG:  11/12/12 normal  ROS:  Pertinent items are noted in HPI.  Otherwise, a comprehensive ROS was negative.  Exam:   BP 108/68  Pulse 60  Resp 16  Ht 5\' 2"  (1.575 m)  Wt 166 lb (75.297 kg)  BMI 30.35 kg/m2  LMP 09/29/2013    Height: 5\' 2"  (157.5 cm)  Ht Readings from Last 3 Encounters:  09/30/13 5\' 2"  (1.575 m)  07/08/13 5' 2.25" (1.581 m)  06/28/13 5' 2.25" (1.581 m)    General appearance: alert, cooperative and appears stated age Head: Normocephalic, without obvious abnormality, atraumatic Abdomen: soft, non-tender; bowel sounds normal; no masses,  no organomegaly Extremities: extremities normal, atraumatic, no cyanosis or edema Skin: Skin color, texture, turgor normal. No rashes or lesions Lymph nodes: Cervical, supraclavicular, and axillary nodes normal. No abnormal inguinal nodes palpated Neurologic: Grossly normal   Pelvic: External genitalia:  no lesions              Urethra:  normal appearing urethra with no masses, tenderness or lesions              Bartholins and Skenes: normal                 Vagina: normal appearing vagina with normal color and discharge, no lesions, blood present              Cervix: no lesions              Pap taken: no Bimanual Exam:  Uterus:  normal size, contour, position, consistency,  mobility, non-tender              Adnexa: normal adnexa and no mass, fullness, tenderness               Rectovaginal: Confirms               Anus:  normal sphincter tone, no lesions  A:  Vaginal discharge  P:   R/O std's.  Gc/Chl obtained.  Due to bleeding, cannot due wet smear.  Metrogel 0.75% one applicator QHS for 5 days given.  Pt will monitor and if continues, will let me know.  Would have her return for wet smear then.   An After Visit Summary was printed and given to the patient.

## 2013-09-30 NOTE — Telephone Encounter (Signed)
Patient notified of all lab results.

## 2013-10-03 ENCOUNTER — Ambulatory Visit: Payer: BC Managed Care – PPO | Admitting: Obstetrics & Gynecology

## 2013-10-06 ENCOUNTER — Telehealth: Payer: Self-pay

## 2013-10-06 NOTE — Telephone Encounter (Signed)
Patient returning Kelly's call. °

## 2013-10-06 NOTE — Telephone Encounter (Signed)
lmtcb

## 2013-10-06 NOTE — Telephone Encounter (Signed)
Patient notified of all results. 

## 2013-10-06 NOTE — Telephone Encounter (Signed)
Message copied by Elisha Headland on Thu Oct 06, 2013  2:04 PM ------      Message from: Jerene Bears      Created: Wed Oct 05, 2013  9:14 AM       Inform GC/chl neg ------

## 2013-12-20 ENCOUNTER — Other Ambulatory Visit: Payer: Self-pay | Admitting: Obstetrics & Gynecology

## 2013-12-20 DIAGNOSIS — Z1231 Encounter for screening mammogram for malignant neoplasm of breast: Secondary | ICD-10-CM

## 2013-12-29 ENCOUNTER — Ambulatory Visit (HOSPITAL_COMMUNITY)
Admission: RE | Admit: 2013-12-29 | Discharge: 2013-12-29 | Disposition: A | Discharge status: Home / Self Care | Payer: BC Managed Care – PPO | Source: Ambulatory Visit | Attending: Obstetrics & Gynecology | Admitting: Obstetrics & Gynecology

## 2013-12-29 ENCOUNTER — Ambulatory Visit (HOSPITAL_COMMUNITY): Payer: BC Managed Care – PPO

## 2013-12-29 DIAGNOSIS — Z1231 Encounter for screening mammogram for malignant neoplasm of breast: Secondary | ICD-10-CM | POA: Insufficient documentation

## 2013-12-30 ENCOUNTER — Other Ambulatory Visit: Payer: Self-pay | Admitting: Obstetrics & Gynecology

## 2013-12-30 DIAGNOSIS — R928 Other abnormal and inconclusive findings on diagnostic imaging of breast: Secondary | ICD-10-CM

## 2014-01-09 ENCOUNTER — Ambulatory Visit
Admission: RE | Admit: 2014-01-09 | Discharge: 2014-01-09 | Disposition: A | Discharge status: Home / Self Care | Payer: BC Managed Care – PPO | Source: Ambulatory Visit | Attending: Obstetrics & Gynecology | Admitting: Obstetrics & Gynecology

## 2014-01-09 DIAGNOSIS — R928 Other abnormal and inconclusive findings on diagnostic imaging of breast: Secondary | ICD-10-CM

## 2014-01-10 ENCOUNTER — Other Ambulatory Visit: Payer: BC Managed Care – PPO

## 2014-01-16 ENCOUNTER — Other Ambulatory Visit: Payer: BC Managed Care – PPO

## 2014-07-10 ENCOUNTER — Ambulatory Visit (INDEPENDENT_AMBULATORY_CARE_PROVIDER_SITE_OTHER): Payer: BC Managed Care – PPO | Admitting: Obstetrics & Gynecology

## 2014-07-10 ENCOUNTER — Telehealth: Payer: Self-pay | Admitting: Obstetrics & Gynecology

## 2014-07-10 ENCOUNTER — Encounter: Payer: Self-pay | Admitting: Obstetrics & Gynecology

## 2014-07-10 VITALS — BP 100/70 | HR 76 | Resp 18 | Ht 62.5 in | Wt 167.0 lb

## 2014-07-10 DIAGNOSIS — Z01419 Encounter for gynecological examination (general) (routine) without abnormal findings: Secondary | ICD-10-CM

## 2014-07-10 DIAGNOSIS — Z0000 Encounter for general adult medical examination without abnormal findings: Secondary | ICD-10-CM

## 2014-07-10 DIAGNOSIS — Z124 Encounter for screening for malignant neoplasm of cervix: Secondary | ICD-10-CM

## 2014-07-10 DIAGNOSIS — N951 Menopausal and female climacteric states: Secondary | ICD-10-CM

## 2014-07-10 DIAGNOSIS — R232 Flushing: Secondary | ICD-10-CM

## 2014-07-10 LAB — POCT URINALYSIS DIPSTICK
BILIRUBIN UA: NEGATIVE
Glucose, UA: NEGATIVE
KETONES UA: NEGATIVE
NITRITE UA: NEGATIVE
PH UA: 5
Protein, UA: NEGATIVE
Urobilinogen, UA: NEGATIVE

## 2014-07-10 MED ORDER — CLOBETASOL PROPIONATE 0.05 % EX OINT: 1.0000 "application " | TOPICAL_OINTMENT | Freq: Two times a day (BID) | CUTANEOUS | Status: DC

## 2014-07-10 NOTE — Progress Notes (Signed)
48 y.o. [id number] MarriedCaucasianF here for annual exam.  Doing well.  Corliss Parish is going to [employer] in early August.  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    Has not had any UTIs this year.  Had several last year.  No vaginal bleeding.  Having more hot flashes this year.    No LMP recorded. Patient is not currently having periods (Reason: IUD).          Sexually active: Yes.    The current method of family planning is IUD.    Exercising: Yes.    Walking Smoker:  no  Health Maintenance: Pap: 05/2012 Neg. HR HPV Neg History of abnormal Pap:  no MMG: 12/29/13 BIRADS0: Incomplete, 01/09/14 Right breast BIRADS1: Neg  Colonoscopy: N/A  BMD: N/A TDaP: 05/2010 Screening Labs: 7/14, Hb today: 7/14, Urine today: tr RBC   reports that she quit smoking about 7 months ago. She has never used smokeless tobacco. She reports that she does not drink alcohol or use illicit drugs.  Past Medical History  Diagnosis Date  . Gallstone     Past Surgical History  Procedure Laterality Date  . Iud removal      removal of paragard 10/31/10  . Intrauterine device insertion      mirena insertion 12/25/10  . Cholecystectomy  2/12    secondary to gallbladder polyp    Current Outpatient Prescriptions  Medication Sig Dispense Refill  . levonorgestrel (MIRENA) 20 MCG/24HR IUD 1 each by Intrauterine route once.       No current facility-administered medications for this visit.    Family History  Problem Relation Age of Onset  . Hypertension Mother   . Diabetes Father   . Diabetes Paternal Grandmother     ROS:  Pertinent items are noted in HPI.  Otherwise, a comprehensive ROS was negative.  Exam:   BP 100/70  Pulse 76  Resp 18  Ht 5' 2.5" (1.588 m)  Wt 167 lb (75.751 kg)  BMI 30.04 kg/m2   Height: 5' 2.5" (158.8 cm)  Ht Readings from Last 3 Encounters:  07/10/14 5' 2.5" (1.588 m)  09/30/13 5\' 2"  (1.575 m)  07/08/13 5' 2.25" (1.581 m)    General appearance: alert, cooperative and appears stated  age Head: Normocephalic, without obvious abnormality, atraumatic Neck: no adenopathy, supple, symmetrical, trachea midline and thyroid normal to inspection and palpation Lungs: clear to auscultation bilaterally Breasts: normal appearance, no masses or tenderness Heart: regular rate and rhythm Abdomen: soft, non-tender; bowel sounds normal; no masses,  no organomegaly Extremities: extremities normal, atraumatic, no cyanosis or edema Skin: Skin color, texture, turgor normal. No rashes or lesions Lymph nodes: Cervical, supraclavicular, and axillary nodes normal. No abnormal inguinal nodes palpated Neurologic: Grossly normal   Pelvic: External genitalia:  Darkened rash with raised bumps in superior aspect of gluteal cleft              Urethra:  normal appearing urethra with no masses, tenderness or lesions              Bartholins and Skenes: normal                 Vagina: normal appearing vagina with normal color and discharge, no lesions              Cervix: no lesions              Pap taken: Yes.   Bimanual Exam:  Uterus:  normal size, contour, position, consistency, mobility, non-tender  Adnexa: normal adnexa and no mass, fullness, tenderness               Rectovaginal: Confirms               Anus:  normal sphincter tone, no lesions  A:  Well Woman with normal exam  mirena IUD placed 12/25/10, amenorrhea with IUD  Increased hot flashes Buttocks skin rash  P: mammogram  pap smear with neg HR HPV 6/13.  Pap only today. FSH  Labs all normal last year  Temovate ointment 0.05% bid x 14 days.  Recheck 2-3 weeks return annually or prn  An After Visit Summary was printed and given to the patient.

## 2014-07-10 NOTE — Telephone Encounter (Signed)
Patient due for a two week recheck with Dr. Hyacinth MeekerMiller 07/24/14. There are not any slots available. Please advise.

## 2014-07-10 NOTE — Patient Instructions (Signed)

## 2014-07-10 NOTE — Telephone Encounter (Signed)
Offer 4pm on Monday 07-24-14.

## 2014-07-11 LAB — FOLLICLE STIMULATING HORMONE: FSH: 17.9 m[IU]/mL

## 2014-07-11 NOTE — Telephone Encounter (Signed)
Left appointment date/time details on the patient's voicemail (okay per DPR on file).

## 2014-07-12 LAB — IPS PAP TEST WITH REFLEX TO HPV

## 2014-07-12 NOTE — Telephone Encounter (Signed)
Routing to provider for final review. Patient agreeable to disposition. Will close encounter.     

## 2014-07-13 ENCOUNTER — Telehealth: Payer: Self-pay

## 2014-07-13 NOTE — Telephone Encounter (Signed)
RETURNING A CALL TO kELLY

## 2014-07-13 NOTE — Telephone Encounter (Signed)
Message copied by Elisha HeadlandNIX, Chery Giusto S on Thu Jul 13, 2014  1:08 PM ------      Message from: Jerene BearsMILLER, MARY S      Created: Wed Jul 12, 2014 11:07 AM       Please inform pt this is mildly elevated putting her close to menopause but not there yet.  I don't want to remove her IUD.  This explains the increased hot flashes.  Will recheck next year. ------

## 2014-07-13 NOTE — Telephone Encounter (Signed)
Lmtcb//kn 

## 2014-07-14 NOTE — Telephone Encounter (Signed)
Patient notified of results.//kn 

## 2014-07-20 ENCOUNTER — Telehealth: Payer: Self-pay | Admitting: Obstetrics & Gynecology

## 2014-07-20 NOTE — Telephone Encounter (Signed)
Dr.Miller, patient was seen on 7/27 for aex. Patient had rash on buttocks and was given temovate ointment. Patient was to have recheck in 2-3 weeks. Patient has cancelled appointment stating that she is doing fine and the medication is working. Is this okay?

## 2014-07-20 NOTE — Telephone Encounter (Signed)
That is ok as it was just a generalized rash.  I told her she could cancel if improved.  Thanks.  Encounter closed.

## 2014-07-20 NOTE — Telephone Encounter (Signed)
Pt cancelled recheck appointment on Monday. Says she is doing fine and the medication is working.

## 2014-07-24 ENCOUNTER — Ambulatory Visit: Payer: BC Managed Care – PPO | Admitting: Obstetrics & Gynecology

## 2014-09-29 ENCOUNTER — Telehealth: Payer: Self-pay | Admitting: Obstetrics & Gynecology

## 2014-09-29 NOTE — Telephone Encounter (Signed)
Patient has a rash she would like evaluated. Patient says she had this rash in the past.

## 2014-09-29 NOTE — Telephone Encounter (Signed)
Call to patient. States she has has red, bumpy rash on buttock for 3 weeks. Similar to rash previously treated with Clobetasol by dr Hyacinth MeekerMiller but this time the Clobetasol is not helping. Very itchy. Patient will be leaving town for a week starting on 10-03-14. Since office now closed and will be traveling next week, advised she could consider going to urgent care over weekend so she could begin treatment versus OV with Dr Hyacinth Meekermiller on Monday. Patient states she prefers OV with dr Hyacinth MeekerMiller if she can wait till then so appointment sched for Mon 10-02-14 at 0845 and she can cancel if decides to go to urgent care. Discussed oral or topical Benadryl prn itching until seen.   Routing to provider for final review. Patient agreeable to disposition. Will close encounter

## 2014-09-30 ENCOUNTER — Encounter (HOSPITAL_COMMUNITY): Payer: Self-pay | Admitting: Emergency Medicine

## 2014-09-30 ENCOUNTER — Emergency Department (INDEPENDENT_AMBULATORY_CARE_PROVIDER_SITE_OTHER)
Admission: EM | Admit: 2014-09-30 | Discharge: 2014-09-30 | Disposition: A | Discharge status: Home / Self Care | Payer: BC Managed Care – PPO | Source: Home / Self Care | Attending: Family Medicine | Admitting: Family Medicine

## 2014-09-30 DIAGNOSIS — B354 Tinea corporis: Secondary | ICD-10-CM

## 2014-09-30 MED ORDER — TERBINAFINE HCL 1 % EX CREA: 1.0000 "application " | TOPICAL_CREAM | Freq: Two times a day (BID) | CUTANEOUS | Status: DC

## 2014-09-30 NOTE — Discharge Instructions (Signed)

## 2014-09-30 NOTE — ED Notes (Signed)
Pt states that she has had a rash for 3 weeks with no improvement. Pt denies any pain with rash just irritation. Pt in no acute distress at this time.

## 2014-09-30 NOTE — ED Provider Notes (Signed)
CSN: [id number]     Arrival date & time 09/30/14  1304 History   First MD Initiated Contact with Patient 09/30/14 1332     Chief Complaint  Patient presents with  . Rash    on buttocks   (Consider location/radiation/quality/duration/timing/severity/associated sxs/prior Treatment) HPI Comments: Patient reports four week history of pruritic rash on right medial buttock. Rash has not responded to use of topical benadryl and topical clobetasol.  Patient reports herself to be otherwise healthy and without previous episodes.   Patient is a 48 y.o. female presenting with rash. The history is provided by the patient.  Rash   Past Medical History  Diagnosis Date  . Gallstone    Past Surgical History  Procedure Laterality Date  . Iud removal      removal of paragard 10/31/10  . Intrauterine device insertion      mirena insertion 12/25/10  . Cholecystectomy  2/12    secondary to gallbladder polyp   Family History  Problem Relation Age of Onset  . Hypertension Mother   . Diabetes Father   . Diabetes Paternal Grandmother    History  Substance Use Topics  . Smoking status: Former Smoker    Quit date: 11/14/2013  . Smokeless tobacco: Never Used     Comment: social smoker  . Alcohol Use: No     Comment: occ wine   OB History   Grav Para Term Preterm Abortions TAB SAB Ect Mult Living   3 3 3       3      Review of Systems  Skin: Positive for rash.  All other systems reviewed and are negative.   Allergies  Bactrim  Home Medications   Prior to Admission medications   Medication Sig Start Date End Date Taking? Authorizing Provider  levonorgestrel (MIRENA) 20 MCG/24HR IUD 1 each by Intrauterine route once.   Yes Historical Provider, MD  clobetasol ointment (TEMOVATE) 0.05 % Apply 1 application topically 2 (two) times daily. Apply as directed twice daily 07/10/14   Annamaria BootsMary Suzanne Miller, MD  terbinafine (LAMISIL AT) 1 % cream Apply 1 application topically 2 (two) times daily. X  14 days 09/30/14   Jess BartersJennifer Lee H Presson, PA   BP 125/63  Pulse 66  Temp(Src) 98.1 F (36.7 C) (Oral)  Resp 16  SpO2 99% Physical Exam  Nursing note and vitals reviewed. Constitutional: She is oriented to person, place, and time. She appears well-developed and well-nourished. No distress.  HENT:  Head: Normocephalic and atraumatic.  Cardiovascular: Normal rate.   Pulmonary/Chest: Effort normal.  Musculoskeletal: Normal range of motion.  Neurological: She is alert and oriented to person, place, and time.  Skin: Skin is warm and dry. Rash noted.  5cm x 5cm annular erythematous lesion with raised border.   Psychiatric: She has a normal mood and affect. Her behavior is normal.    ED Course  Procedures (including critical care time) Labs Review Labs Reviewed - No data to display  Imaging Review No results found.   MDM   1. Ringworm of body   Lamisil (topical) as prescribed x 14 days with PCP or dermatology follow up if no improvement.     Ria ClockJennifer Lee H Presson, [place] 09/30/14 1352

## 2014-10-02 ENCOUNTER — Telehealth: Payer: Self-pay | Admitting: Obstetrics & Gynecology

## 2014-10-02 ENCOUNTER — Ambulatory Visit: Payer: BC Managed Care – PPO | Admitting: Obstetrics & Gynecology

## 2014-10-02 NOTE — Telephone Encounter (Signed)
Patient called and cancelled her appointment for a "rash on buttocks" with Dr. Hyacinth MeekerMiller today due to being seen at an urgent care over the weekend.

## 2014-10-02 NOTE — Telephone Encounter (Signed)
Thank you. Encounter closed. 

## 2014-10-04 NOTE — ED Provider Notes (Signed)
Medical screening examination/treatment/procedure(s) were performed by resident physician or non-physician practitioner and as supervising physician I was immediately available for consultation/collaboration.   KINDL,JAMES DOUGLAS MD.   James D Kindl, MD 10/04/14 2007 

## 2014-10-16 ENCOUNTER — Encounter (HOSPITAL_COMMUNITY): Payer: Self-pay | Admitting: Emergency Medicine

## 2014-11-03 ENCOUNTER — Ambulatory Visit: Payer: BC Managed Care – PPO | Admitting: Obstetrics & Gynecology

## 2015-01-11 ENCOUNTER — Ambulatory Visit: Payer: BC Managed Care – PPO | Admitting: Certified Nurse Midwife

## 2015-01-11 ENCOUNTER — Telehealth: Payer: Self-pay | Admitting: Certified Nurse Midwife

## 2015-01-11 NOTE — Telephone Encounter (Signed)
Patient canceled her uti appointment today. She says "she is feeling better and will continue with the medication".

## 2015-02-09 ENCOUNTER — Ambulatory Visit (INDEPENDENT_AMBULATORY_CARE_PROVIDER_SITE_OTHER): Payer: BC Managed Care – PPO | Admitting: Emergency Medicine

## 2015-02-09 ENCOUNTER — Telehealth: Payer: Self-pay | Admitting: Obstetrics & Gynecology

## 2015-02-09 VITALS — BP 106/70 | HR 86 | Temp 98.6°F | Resp 18 | Ht 62.0 in | Wt 165.0 lb

## 2015-02-09 DIAGNOSIS — J0140 Acute pansinusitis, unspecified: Secondary | ICD-10-CM

## 2015-02-09 MED ORDER — AMOXICILLIN-POT CLAVULANATE 875-125 MG PO TABS: 1.0000 | ORAL_TABLET | Freq: Two times a day (BID) | ORAL | Status: DC

## 2015-02-09 MED ORDER — PSEUDOEPHEDRINE-GUAIFENESIN ER 60-600 MG PO TB12: 1.0000 | ORAL_TABLET | Freq: Two times a day (BID) | ORAL | Status: AC

## 2015-02-09 NOTE — Telephone Encounter (Signed)
Pt states she has a swollen gland on the right side of her neck w/nose bleeds. Asks to see someone today or if she should go to urgent care.  Chart to [employer] desk

## 2015-02-09 NOTE — Patient Instructions (Signed)

## 2015-02-09 NOTE — Telephone Encounter (Signed)
Spoke with patient. She states she has been having sinus pain and headaches for one week. Using Advil without relief. Pt does not have pcp.   Advised patient to seek care at local urgent care. Patient has questions regarding coverage for urgent care visits, advised to contact customer service number on back of insurance card for further information. Patient agreeable. Routing to provider for final review. Patient agreeable to disposition. Will close encounter

## 2015-02-09 NOTE — Progress Notes (Signed)
[facility] [address], [place] [address] [phone]  Date:  02/09/2015   Name:  Felicia Gill   DOB:  29-Dec-1965   MRN:  [id number]  PCP:  No PCP Per Patient    Chief Complaint: Headache; Adenopathy; and Nasal Congestion   History of Present Illness:  Felicia Mondayrene M Dor is a 49 y.o. very pleasant female patient who presents with the following:  Ill for two weeks with nasal congestion and purulent drainage Has frontal headache. No neuro or visual symptoms No sore throat No cough or coryza. No fever or chills No nausea or vomiting.  No stool change No improvement with over the counter medications or other home remedies. Denies other complaint or health concern today.   There are no active problems to display for this patient.   Past Medical History  Diagnosis Date  . Gallstone     Past Surgical History  Procedure Laterality Date  . Iud removal      removal of paragard 10/31/10  . Intrauterine device insertion      mirena insertion 12/25/10  . Cholecystectomy  2/12    secondary to gallbladder polyp    History  Substance Use Topics  . Smoking status: Former Smoker    Quit date: 11/14/2013  . Smokeless tobacco: Never Used     Comment: social smoker  . Alcohol Use: No     Comment: occ wine    Family History  Problem Relation Age of Onset  . Hypertension Mother   . Diabetes Father   . Diabetes Paternal Grandmother     Allergies  Allergen Reactions  . Bactrim [Sulfamethoxazole-Trimethoprim] Shortness Of Breath  . Sulfa Antibiotics Anaphylaxis    Medication list has been reviewed and updated.  Current Outpatient Prescriptions on File Prior to Visit  Medication Sig Dispense Refill  . clobetasol ointment (TEMOVATE) 0.05 % Apply 1 application topically 2 (two) times daily. Apply as directed twice daily 60 g 0  . levonorgestrel (MIRENA) 20 MCG/24HR IUD 1 each by Intrauterine route once.    . terbinafine (LAMISIL AT) 1 % cream Apply 1  application topically 2 (two) times daily. X 14 days (Patient not taking: Reported on 02/09/2015) 30 g 0   No current facility-administered medications on file prior to visit.    Review of Systems:  As per HPI, otherwise negative.    Physical Examination: Filed Vitals:   02/09/15 1257  BP: 106/70  Pulse: 86  Temp: 98.6 F (37 C)  Resp: 18   Filed Vitals:   02/09/15 1257  Height: 5\' 2"  (1.575 m)  Weight: 165 lb (74.844 kg)   Body mass index is 30.17 kg/(m^2). Ideal Body Weight: Weight in (lb) to have BMI = 25: 136.4  GEN: WDWN, NAD, Non-toxic, A & O x 3 HEENT: Atraumatic, Normocephalic. Neck supple. No masses, No LAD. Ears and Nose: No external deformity. CV: RRR, No M/G/R. No JVD. No thrill. No extra heart sounds. PULM: CTA B, no wheezes, crackles, rhonchi. No retractions. No resp. distress. No accessory muscle use. ABD: S, NT, ND, +BS. No rebound. No HSM. EXTR: No c/c/e NEURO Normal gait.  PSYCH: Normally interactive. Conversant. Not depressed or anxious appearing.  Calm demeanor.    Assessment and Plan: Sinusitis augmentin mucinex d  Signed,  Phillips OdorJeffery Anderson, MD

## 2015-03-19 ENCOUNTER — Other Ambulatory Visit: Payer: Self-pay | Admitting: Obstetrics & Gynecology

## 2015-03-19 DIAGNOSIS — Z1231 Encounter for screening mammogram for malignant neoplasm of breast: Secondary | ICD-10-CM

## 2015-03-22 ENCOUNTER — Ambulatory Visit (HOSPITAL_COMMUNITY)
Admission: RE | Admit: 2015-03-22 | Discharge: 2015-03-22 | Disposition: A | Discharge status: Home / Self Care | Payer: BC Managed Care – PPO | Source: Ambulatory Visit | Attending: Obstetrics & Gynecology | Admitting: Obstetrics & Gynecology

## 2015-03-22 ENCOUNTER — Ambulatory Visit (HOSPITAL_COMMUNITY): Payer: BC Managed Care – PPO

## 2015-03-22 DIAGNOSIS — Z1231 Encounter for screening mammogram for malignant neoplasm of breast: Secondary | ICD-10-CM | POA: Diagnosis not present

## 2015-07-27 ENCOUNTER — Ambulatory Visit (INDEPENDENT_AMBULATORY_CARE_PROVIDER_SITE_OTHER): Payer: BC Managed Care – PPO | Admitting: Obstetrics & Gynecology

## 2015-07-27 ENCOUNTER — Encounter: Payer: Self-pay | Admitting: Obstetrics & Gynecology

## 2015-07-27 VITALS — BP 100/64 | HR 60 | Resp 16 | Ht 62.5 in | Wt 169.0 lb

## 2015-07-27 DIAGNOSIS — Z1211 Encounter for screening for malignant neoplasm of colon: Secondary | ICD-10-CM

## 2015-07-27 DIAGNOSIS — N912 Amenorrhea, unspecified: Secondary | ICD-10-CM | POA: Diagnosis not present

## 2015-07-27 DIAGNOSIS — Z0000 Encounter for general adult medical examination without abnormal findings: Secondary | ICD-10-CM | POA: Diagnosis not present

## 2015-07-27 DIAGNOSIS — Z01419 Encounter for gynecological examination (general) (routine) without abnormal findings: Secondary | ICD-10-CM | POA: Diagnosis not present

## 2015-07-27 LAB — COMPREHENSIVE METABOLIC PANEL
ALBUMIN: 4 g/dL (ref 3.6–5.1)
ALK PHOS: 104 U/L (ref 33–130)
ALT: 15 U/L (ref 6–29)
AST: 17 U/L (ref 10–35)
BILIRUBIN TOTAL: 0.4 mg/dL (ref 0.2–1.2)
BUN: 12 mg/dL (ref 7–25)
CALCIUM: 9.4 mg/dL (ref 8.6–10.4)
CO2: 24 mmol/L (ref 20–31)
CREATININE: 0.82 mg/dL (ref 0.50–1.05)
Chloride: 102 mmol/L (ref 98–110)
GLUCOSE: 74 mg/dL (ref 65–99)
Potassium: 4.1 mmol/L (ref 3.5–5.3)
SODIUM: 138 mmol/L (ref 135–146)
Total Protein: 7.5 g/dL (ref 6.1–8.1)

## 2015-07-27 LAB — LIPID PANEL
CHOL/HDL RATIO: 4 ratio (ref <=5.0)
Cholesterol: 161 mg/dL (ref 125–200)
HDL: 40 mg/dL — AB (ref >=46)
LDL CALC: 93 mg/dL (ref <130)
TRIGLYCERIDES: 138 mg/dL (ref <150)
VLDL: 28 mg/dL (ref <30)

## 2015-07-27 LAB — HEMOGLOBIN, FINGERSTICK: Hemoglobin, fingerstick: 15 g/dL (ref 12.0–16.0)

## 2015-07-27 NOTE — Progress Notes (Signed)
49 y.o. [name] MarriedAsianF here for annual exam.  Doing well.  No vaginal bleeding.  Oldest is 24.  Youngest just turned 19.  He just went back to [employer] for sophomore year.    Continues to have hot flashes.  FSH 17 last year.  Denies VB.  No LMP recorded. Patient is not currently having periods (Reason: IUD).          Sexually active: Yes.    The current method of family planning is IUD. Mirena placed 12/25/10   Exercising: Yes.    walking Smoker:  Former social smoker  Health Maintenance: Pap:  07/10/14 WNL, neg HR HPV 2013. History of abnormal Pap:  no MMG:  03/22/15 BiRads 1-normal Colonoscopy:  none BMD:   none TDaP:  06/04/10 Screening Labs: today, Hb today: 15.0, Urine today: WBC-1+, PH-6.0   reports that she quit smoking about 20 months ago. She has never used smokeless tobacco. She reports that she does not drink alcohol or use illicit drugs.  Past Medical History  Diagnosis Date  . Gallstone     Past Surgical History  Procedure Laterality Date  . Iud removal      removal of paragard 10/31/10  . Intrauterine device insertion      mirena insertion 12/25/10  . Cholecystectomy  2/12    secondary to gallbladder polyp    Current Outpatient Prescriptions  Medication Sig Dispense Refill  . levonorgestrel (MIRENA) 20 MCG/24HR IUD 1 each by Intrauterine route once.    . clobetasol ointment (TEMOVATE) 0.05 % Apply 1 application topically 2 (two) times daily. Apply as directed twice daily (Patient not taking: Reported on 07/27/2015) 60 g 0  . pseudoephedrine-guaifenesin (MUCINEX D) 60-600 MG per tablet Take 1 tablet by mouth every 12 (twelve) hours. (Patient not taking: Reported on 07/27/2015) 18 tablet 0  . terbinafine (LAMISIL AT) 1 % cream Apply 1 application topically 2 (two) times daily. X 14 days (Patient not taking: Reported on 07/27/2015) 30 g 0   No current facility-administered medications for this visit.    Family History  Problem Relation Age of Onset  .  Hypertension Mother   . Diabetes Father   . Diabetes Paternal Grandmother     ROS:  Pertinent items are noted in HPI.  Otherwise, a comprehensive ROS was negative.  Exam:   BP 100/64 mmHg  Pulse 60  Resp 16  Ht 5' 2.5" (1.588 m)  Wt 169 lb (76.658 kg)  BMI 30.40 kg/m2  Weight change: -2#   Height: 5' 2.5" (158.8 cm)  Ht Readings from Last 3 Encounters:  07/27/15 5' 2.5" (1.588 m)  02/09/15 5\' 2"  (1.575 m)  07/10/14 5' 2.5" (1.588 m)    General appearance: alert, cooperative and appears stated age Head: Normocephalic, without obvious abnormality, atraumatic Neck: no adenopathy, supple, symmetrical, trachea midline and thyroid normal to inspection and palpation Lungs: clear to auscultation bilaterally Breasts: normal appearance, no masses or tenderness Heart: regular rate and rhythm Abdomen: soft, non-tender; bowel sounds normal; no masses,  no organomegaly Extremities: extremities normal, atraumatic, no cyanosis or edema Skin: Skin color, texture, turgor normal. No rashes or lesions Lymph nodes: Cervical, supraclavicular, and axillary nodes normal. No abnormal inguinal nodes palpated Neurologic: Grossly normal   Pelvic: External genitalia:  no lesions              Urethra:  normal appearing urethra with no masses, tenderness or lesions  Bartholins and Skenes: normal                 Vagina: normal appearing vagina with normal color and discharge, no lesions              Cervix: no lesions, IUD string not seen              Pap taken: No. Bimanual Exam:  Uterus:  normal size, contour, position, consistency, mobility, non-tender              Adnexa: normal adnexa and no mass, fullness, tenderness               Rectovaginal: Confirms               Anus:  normal sphincter tone, no lesions  Chaperone was present for exam.  A:  Well Woman with normal exam  mirena IUD placed 12/25/10, amenorrhea with IUD.  Cannot see string.  Placement confirmed via PUS  03/12/11. Hot flashes  P: mammogram  pap smear with neg HR HPV 6/13. Pap only today. FSH.  Pt is pretty sure she just wants her IUD out in January and not a new one placed at that time CMP, TSH, Lipids today Referral for screening colonoscopy return annually or prn

## 2015-07-28 LAB — TSH: TSH: 2.334 u[IU]/mL (ref 0.350–4.500)

## 2015-07-28 LAB — FOLLICLE STIMULATING HORMONE: FSH: 46.4 m[IU]/mL

## 2015-08-02 ENCOUNTER — Telehealth: Payer: Self-pay | Admitting: Emergency Medicine

## 2015-08-02 DIAGNOSIS — Z30432 Encounter for removal of intrauterine contraceptive device: Secondary | ICD-10-CM

## 2015-08-02 NOTE — Telephone Encounter (Signed)
-----   Message from Jerene Bears, MD sent at 07/30/2015 10:25 AM EDT ----- Inform TSH, CMP, lipids look good.  HDLs are a little low but everything else is normal.  FSH 46.  IUD due to be changed early next year.  Because this level is elevated, indicating menopause, ok to leave in until AEX next year and I will remove it at the same time as her AEX.  Thanks.

## 2015-08-02 NOTE — Telephone Encounter (Signed)
Patient given message from Dr. Hyacinth Meeker. She is agreeable to plan with IUD and is scheduled for annual exam 11/11/16. Verbalized understanding of results.  Order for future IUD removal placed.   Routing to provider for final review. Patient agreeable to disposition. Will close encounter.

## 2015-08-13 ENCOUNTER — Telehealth: Payer: Self-pay | Admitting: Obstetrics & Gynecology

## 2015-08-13 NOTE — Telephone Encounter (Signed)
Call to patient to discuss insurance benefits for IUD removal. Left message on voicemail to return call to discuss ° °

## 2015-08-15 NOTE — Telephone Encounter (Signed)
Call to patient to discuss insurance benefits for IUD removal. Left message on voicemail to return call to discuss

## 2015-08-15 NOTE — Telephone Encounter (Signed)
Spoke with patient. Advised of benefits. Patient is agreeable and will call back in January to schedule IUD removal. Advised we will re check benefits in January.   Routing to provider for final review. Patient agreeable to disposition. Will close encounter.

## 2015-08-24 ENCOUNTER — Telehealth: Payer: Self-pay | Admitting: Obstetrics & Gynecology

## 2015-08-24 NOTE — Telephone Encounter (Signed)
Left voicemail regarding referral appointment. Attempting to schedule with [facility] and they've been unsuccessful in reach patient. Calling to follow up.

## 2015-09-04 NOTE — Telephone Encounter (Signed)
2nd attempt to call patient regarding referral appointment. [facility] has attempted once as well. Patient identifies herself on voicemail. Left request for return call.  Routing to provider for review.

## 2015-09-04 NOTE — Telephone Encounter (Signed)
Thank you. Encounter closed. 

## 2015-09-05 ENCOUNTER — Telehealth: Payer: Self-pay | Admitting: Obstetrics & Gynecology

## 2015-09-05 NOTE — Telephone Encounter (Signed)
Patient returning call to [facility].

## 2015-09-06 NOTE — Telephone Encounter (Signed)
Called patient. Left voicemail with contact info for [facility] @ [phone]. She needs to call their office to schedule.

## 2015-12-08 ENCOUNTER — Other Ambulatory Visit: Payer: Self-pay | Admitting: Emergency Medicine

## 2015-12-10 ENCOUNTER — Ambulatory Visit (INDEPENDENT_AMBULATORY_CARE_PROVIDER_SITE_OTHER): Payer: BC Managed Care – PPO | Admitting: Emergency Medicine

## 2015-12-10 VITALS — BP 102/70 | HR 79 | Temp 98.2°F | Resp 12 | Ht 62.25 in | Wt 170.0 lb

## 2015-12-10 DIAGNOSIS — J0140 Acute pansinusitis, unspecified: Secondary | ICD-10-CM

## 2015-12-10 DIAGNOSIS — J209 Acute bronchitis, unspecified: Secondary | ICD-10-CM

## 2015-12-10 MED ORDER — HYDROCOD POLST-CPM POLST ER 10-8 MG/5ML PO SUER: 5.0000 mL | Freq: Two times a day (BID) | ORAL | Status: DC

## 2015-12-10 MED ORDER — AMOXICILLIN-POT CLAVULANATE 875-125 MG PO TABS: 1.0000 | ORAL_TABLET | Freq: Two times a day (BID) | ORAL | Status: DC

## 2015-12-10 MED ORDER — PSEUDOEPHEDRINE-GUAIFENESIN ER 60-600 MG PO TB12
1.0000 | ORAL_TABLET | Freq: Two times a day (BID) | ORAL | Status: AC
Start: 2015-12-10 — End: 2016-12-09

## 2015-12-10 NOTE — Progress Notes (Signed)
Subjective:  Patient ID: Felicia Gill, female    DOB: 1966/11/19  Age: 49 y.o. MRN: [id number]  CC: Headache; Sinusitis; Nasal Congestion; red eyes; and Cough   HPI DELANO SCARDINO presents   Patient has a a fever she has nasal congestion postnasal drainage and nasal discharge is purulent character. She has  Pressure and pain in her cheek  And forehead. She has a cough productive purulent sputum. And is noticed drainage from her eyes. That's also purulent collar. No nausea vomiting or stool change. No rash. She's had no improvement with over-the-counter medication.  History Felicia Gill has a past medical history of Gallstone.   She has past surgical history that includes IUD removal; Intrauterine device insertion; and Cholecystectomy (2/12).   Her  family history includes Diabetes in her father and paternal grandmother; Hypertension in her mother.  She   reports that she quit smoking about 2 years ago. She has never used smokeless tobacco. She reports that she does not drink alcohol or use illicit drugs.  Outpatient Prescriptions Prior to Visit  Medication Sig Dispense Refill  . levonorgestrel (MIRENA) 20 MCG/24HR IUD 1 each by Intrauterine route once.    . pseudoephedrine-guaifenesin (MUCINEX D) 60-600 MG per tablet Take 1 tablet by mouth every 12 (twelve) hours. 18 tablet 0  . clobetasol ointment (TEMOVATE) 0.05 % Apply 1 application topically 2 (two) times daily. Apply as directed twice daily (Patient not taking: Reported on 07/27/2015) 60 g 0  . terbinafine (LAMISIL AT) 1 % cream Apply 1 application topically 2 (two) times daily. X 14 days (Patient not taking: Reported on 07/27/2015) 30 g 0   No facility-administered medications prior to visit.    Social History   Social History  . Marital Status: Married    Spouse Name: N/A  . Number of Children: N/A  . Years of Education: N/A   Social History Main Topics  . Smoking status: Former Smoker    Quit date: 11/14/2013  . Smokeless  tobacco: Never Used     Comment: social smoker  . Alcohol Use: No  . Drug Use: No  . Sexual Activity:    Partners: Male    Birth Control/ Protection: IUD   Other Topics Concern  . None   Social History Narrative     Review of Systems  Constitutional: Positive for fever. Negative for chills and appetite change.  HENT: Positive for congestion, postnasal drip, rhinorrhea and sinus pressure. Negative for ear pain and sore throat.   Eyes: Positive for discharge. Negative for pain and redness.  Respiratory: Positive for cough. Negative for shortness of breath and wheezing.   Cardiovascular: Negative for leg swelling.  Gastrointestinal: Negative for nausea, vomiting, abdominal pain, diarrhea, constipation and blood in stool.  Endocrine: Negative for polyuria.  Genitourinary: Negative for dysuria, urgency, frequency and flank pain.  Musculoskeletal: Negative for gait problem.  Skin: Negative for rash.  Neurological: Negative for weakness and headaches.  Psychiatric/Behavioral: Negative for confusion and decreased concentration. The patient is not nervous/anxious.     Objective:  BP 102/70 mmHg  Pulse 79  Temp(Src) 98.2 F (36.8 C) (Oral)  Resp 12  Ht 5' 2.25" (1.581 m)  Wt 170 lb (77.111 kg)  BMI 30.85 kg/m2  SpO2 98%  Physical Exam  Constitutional: She is oriented to person, place, and time. She appears well-developed and well-nourished. No distress.  HENT:  Head: Normocephalic and atraumatic.  Right Ear: External ear normal.  Left Ear: External ear normal.  Nose:  Nose normal.  Eyes: Conjunctivae and EOM are normal. Pupils are equal, round, and reactive to light. No scleral icterus.  Neck: Normal range of motion. Neck supple. No tracheal deviation present.  Cardiovascular: Normal rate, regular rhythm and normal heart sounds.   Pulmonary/Chest: Effort normal. No respiratory distress. She has no wheezes. She has no rales.  Abdominal: She exhibits no mass. There is no  tenderness. There is no rebound and no guarding.  Musculoskeletal: She exhibits no edema.  Lymphadenopathy:    She has no cervical adenopathy.  Neurological: She is alert and oriented to person, place, and time. Coordination normal.  Skin: Skin is warm and dry. No rash noted.  Psychiatric: She has a normal mood and affect. Her behavior is normal.      Assessment & Plan:   Karena Addisonrene was seen today for headache, sinusitis, nasal congestion, red eyes and cough.  Diagnoses and all orders for this visit:  Acute bronchitis, unspecified organism  Acute pansinusitis, recurrence not specified  Other orders -     amoxicillin-clavulanate (AUGMENTIN) 875-125 MG tablet; Take 1 tablet by mouth 2 (two) times daily. -     pseudoephedrine-guaifenesin (MUCINEX D) 60-600 MG 12 hr tablet; Take 1 tablet by mouth every 12 (twelve) hours. -     chlorpheniramine-HYDROcodone (TUSSIONEX PENNKINETIC ER) 10-8 MG/5ML SUER; Take 5 mLs by mouth 2 (two) times daily.  I am having Ms. Hett start on amoxicillin-clavulanate, pseudoephedrine-guaifenesin, and chlorpheniramine-HYDROcodone. I am also having her maintain her levonorgestrel, clobetasol ointment, terbinafine, pseudoephedrine-guaifenesin, and naproxen sodium.  Meds ordered this encounter  Medications  . naproxen sodium (ANAPROX) 220 MG tablet    Sig: Take 220 mg by mouth 2 (two) times daily with a meal.  . amoxicillin-clavulanate (AUGMENTIN) 875-125 MG tablet    Sig: Take 1 tablet by mouth 2 (two) times daily.    Dispense:  20 tablet    Refill:  0  . pseudoephedrine-guaifenesin (MUCINEX D) 60-600 MG 12 hr tablet    Sig: Take 1 tablet by mouth every 12 (twelve) hours.    Dispense:  18 tablet    Refill:  0  . chlorpheniramine-HYDROcodone (TUSSIONEX PENNKINETIC ER) 10-8 MG/5ML SUER    Sig: Take 5 mLs by mouth 2 (two) times daily.    Dispense:  60 mL    Refill:  0    Appropriate red flag conditions were discussed with the patient as well as actions that  should be taken.  Patient expressed his understanding.  Follow-up: Return if symptoms worsen or fail to improve.  [name], Berneta Sconyers S, MD

## 2015-12-10 NOTE — Patient Instructions (Signed)

## 2016-01-08 ENCOUNTER — Telehealth: Payer: Self-pay | Admitting: Obstetrics & Gynecology

## 2016-01-08 NOTE — Telephone Encounter (Signed)
Patient is calling to schedule an IUD removal. She states someone was suppose to call her and she has not received a call yet.

## 2016-01-08 NOTE — Telephone Encounter (Signed)
Left message to call Kaitlyn at [phone]. 

## 2016-01-10 NOTE — Telephone Encounter (Signed)
Left message to call Sadat Sliwa at [phone]. 

## 2016-01-10 NOTE — Telephone Encounter (Signed)
Spoke with patient. Appointment for IUD removal scheduled for 01/15/2016 at 3:30 pm with Dr.Miller. Patient is agreeable to date and time. OOP cost for appointment given to patient. Patient is agreeable.  Routing to provider for final review. Patient agreeable to disposition. Will close encounter.

## 2016-01-10 NOTE — Telephone Encounter (Signed)
The reason she wasn't called it because I advised her it was ok to leave in until her AEX and that I would remove it at that time.  This is in the phone note to her with her lab results after her AEX.  It is ok for her to come and have it removed next week but also ok to leave in until her AEX.

## 2016-01-15 ENCOUNTER — Ambulatory Visit: Payer: BC Managed Care – PPO | Admitting: Obstetrics & Gynecology

## 2016-01-15 NOTE — Telephone Encounter (Signed)
Spoke with patient. Advised of message as seen below from Dr.Miller. She would like to have her IUD removed at her aex with Dr.Miller on 11/11/2016. Appointment for IUD removal cancelled.   Routing to provider for final review. Patient agreeable to disposition. Will close encounter.

## 2016-01-15 NOTE — Telephone Encounter (Signed)
Left message to call Kaitlyn at [phone]. 

## 2016-05-28 ENCOUNTER — Other Ambulatory Visit: Payer: Self-pay | Admitting: Obstetrics & Gynecology

## 2016-05-28 DIAGNOSIS — Z1231 Encounter for screening mammogram for malignant neoplasm of breast: Secondary | ICD-10-CM

## 2016-06-09 ENCOUNTER — Ambulatory Visit
Admission: RE | Admit: 2016-06-09 | Discharge: 2016-06-09 | Disposition: A | Discharge status: Home / Self Care | Payer: BC Managed Care – PPO | Source: Ambulatory Visit | Attending: Obstetrics & Gynecology | Admitting: Obstetrics & Gynecology

## 2016-06-09 DIAGNOSIS — Z1231 Encounter for screening mammogram for malignant neoplasm of breast: Secondary | ICD-10-CM

## 2016-06-11 ENCOUNTER — Other Ambulatory Visit: Payer: Self-pay | Admitting: Obstetrics & Gynecology

## 2016-06-11 DIAGNOSIS — R928 Other abnormal and inconclusive findings on diagnostic imaging of breast: Secondary | ICD-10-CM

## 2016-06-18 ENCOUNTER — Ambulatory Visit
Admission: RE | Admit: 2016-06-18 | Discharge: 2016-06-18 | Disposition: A | Discharge status: Home / Self Care | Payer: BC Managed Care – PPO | Source: Ambulatory Visit | Attending: Obstetrics & Gynecology | Admitting: Obstetrics & Gynecology

## 2016-06-18 DIAGNOSIS — R928 Other abnormal and inconclusive findings on diagnostic imaging of breast: Secondary | ICD-10-CM

## 2016-11-03 ENCOUNTER — Telehealth: Payer: Self-pay | Admitting: Obstetrics & Gynecology

## 2016-11-03 DIAGNOSIS — Z30432 Encounter for removal of intrauterine contraceptive device: Secondary | ICD-10-CM

## 2016-11-03 NOTE — Telephone Encounter (Addendum)
Patient was scheduled for aex and iud removal with Dr Hyacinth MeekerMiller on 11/11/16 and will need to reschedule that appointment. Patient's number to reach her is [phone].

## 2016-11-03 NOTE — Telephone Encounter (Signed)
Call to patient to reschedule annual with iud removal originally scheduled for 11/11/16. Left voicemail to return call.

## 2016-11-05 NOTE — Telephone Encounter (Signed)
Aex appointment with IUD removal rescheduled for 12/23/2016 at 8:45 am with Dr.Miller. Patient is agreeable to date and time. New order placed for IUD removal as other order expires prior to her appointment.  Cc: Braxton Feathersebecca Frahm  Routing to provider for final review. Patient agreeable to disposition. Will close encounter.

## 2016-11-11 ENCOUNTER — Ambulatory Visit: Payer: BC Managed Care – PPO | Admitting: Obstetrics & Gynecology

## 2016-12-23 ENCOUNTER — Ambulatory Visit (INDEPENDENT_AMBULATORY_CARE_PROVIDER_SITE_OTHER): Payer: BC Managed Care – PPO | Admitting: Obstetrics & Gynecology

## 2016-12-23 ENCOUNTER — Encounter: Payer: Self-pay | Admitting: Obstetrics & Gynecology

## 2016-12-23 VITALS — BP 106/68 | HR 72 | Resp 14 | Ht 62.0 in | Wt 175.0 lb

## 2016-12-23 DIAGNOSIS — Z01419 Encounter for gynecological examination (general) (routine) without abnormal findings: Secondary | ICD-10-CM

## 2016-12-23 DIAGNOSIS — Z1211 Encounter for screening for malignant neoplasm of colon: Secondary | ICD-10-CM | POA: Diagnosis not present

## 2016-12-23 DIAGNOSIS — Z30432 Encounter for removal of intrauterine contraceptive device: Secondary | ICD-10-CM | POA: Diagnosis not present

## 2016-12-23 DIAGNOSIS — Z0000 Encounter for general adult medical examination without abnormal findings: Secondary | ICD-10-CM

## 2016-12-23 DIAGNOSIS — Z124 Encounter for screening for malignant neoplasm of cervix: Secondary | ICD-10-CM

## 2016-12-23 DIAGNOSIS — Z205 Contact with and (suspected) exposure to viral hepatitis: Secondary | ICD-10-CM | POA: Diagnosis not present

## 2016-12-23 LAB — CBC
HCT: 43.6 % (ref 35.0–45.0)
HEMOGLOBIN: 14.5 g/dL (ref 11.7–15.5)
MCH: 30.1 pg (ref 27.0–33.0)
MCHC: 33.3 g/dL (ref 32.0–36.0)
MCV: 90.5 fL (ref 80.0–100.0)
MPV: 11.7 fL (ref 7.5–12.5)
Platelets: 219 10*3/uL (ref 140–400)
RBC: 4.82 MIL/uL (ref 3.80–5.10)
RDW: 13.4 % (ref 11.0–15.0)
WBC: 9.1 10*3/uL (ref 3.8–10.8)

## 2016-12-23 LAB — POCT URINALYSIS DIPSTICK
BILIRUBIN UA: NEGATIVE
Blood, UA: NEGATIVE
GLUCOSE UA: NEGATIVE
KETONES UA: NEGATIVE
Leukocytes, UA: NEGATIVE
Nitrite, UA: NEGATIVE
Urobilinogen, UA: NEGATIVE
pH, UA: 5

## 2016-12-23 LAB — HEPATITIS C ANTIBODY: HCV Ab: NEGATIVE

## 2016-12-23 NOTE — Patient Instructions (Signed)
[facility] [address], [place], [place] [address]  Phone: [phone]

## 2016-12-23 NOTE — Progress Notes (Signed)
51 y.o. [id number] Married [place] F here for annual exam.  Doing well.    No LMP recorded. Patient is not currently having periods (Reason: IUD).          Sexually active: Yes.    The current method of family planning is IUD.    Exercising: Yes.    walking, zumba Smoker:  Former smoker  Health Maintenance: Pap:  07/10/14 negative  History of abnormal Pap:  no MMG:  06/18/16 BIRADS 2 benign, U/S same day BIRADS 2 benign  Colonoscopy:  never BMD:   never TDaP:  06/04/10 Pneumonia vaccine(s):  never Zostavax:   never Hep C testing: drawn today  Screening Labs: drawn today, Hb today: drawn today, Urine today: trace protein    reports that she quit smoking about 3 years ago. She has never used smokeless tobacco. She reports that she does not drink alcohol or use drugs.  Past Medical History:  Diagnosis Date  . Gallstone     Past Surgical History:  Procedure Laterality Date  . CHOLECYSTECTOMY  2/12   secondary to gallbladder polyp  . INTRAUTERINE DEVICE INSERTION     mirena insertion 12/25/10  . IUD REMOVAL     removal of paragard 10/31/10    Current Outpatient Prescriptions  Medication Sig Dispense Refill  . clobetasol ointment (TEMOVATE) 0.05 % Apply 1 application topically 2 (two) times daily. Apply as directed twice daily 60 g 0  . naproxen sodium (ANAPROX) 220 MG tablet Take 220 mg by mouth 2 (two) times daily with a meal.    . terbinafine (LAMISIL AT) 1 % cream Apply 1 application topically 2 (two) times daily. X 14 days 30 g 0   No current facility-administered medications for this visit.     Family History  Problem Relation Age of Onset  . Hypertension Mother   . Diabetes Father   . Diabetes Paternal Grandmother     ROS:  Pertinent items are noted in HPI.  Otherwise, a comprehensive ROS was negative.  Exam:   BP 106/68 (BP Location: Right Arm, Patient Position: Sitting, Cuff Size: Normal)   Pulse 72   Resp 14   Ht 5\' 2"  (1.575 m)   Wt 175 lb (79.4 kg)    BMI 32.01 kg/m   Weight change: +6#  Height: 5\' 2"  (157.5 cm)  Ht Readings from Last 3 Encounters:  12/23/16 5\' 2"  (1.575 m)  12/10/15 5' 2.25" (1.581 m)  07/27/15 5' 2.5" (1.588 m)   General appearance: alert, cooperative and appears stated age Head: Normocephalic, without obvious abnormality, atraumatic Neck: no adenopathy, supple, symmetrical, trachea midline and thyroid normal to inspection and palpation Lungs: clear to auscultation bilaterally Breasts: normal appearance, no masses or tenderness Heart: regular rate and rhythm Abdomen: soft, non-tender; bowel sounds normal; no masses,  no organomegaly Extremities: extremities normal, atraumatic, no cyanosis or edema Skin: Skin color, texture, turgor normal. No rashes or lesions Lymph nodes: Cervical, supraclavicular, and axillary nodes normal. No abnormal inguinal nodes palpated Neurologic: Grossly normal   Pelvic: External genitalia:  no lesions              Urethra:  normal appearing urethra with no masses, tenderness or lesions              Bartholins and Skenes: normal                 Vagina: normal appearing vagina with normal color and discharge, no lesions  Cervix: no lesions              Pap taken: Yes.   Bimanual Exam:  Uterus:  normal size, contour, position, consistency, mobility, non-tender              Adnexa: normal adnexa and no mass, fullness, tenderness               Rectovaginal: Confirms               Anus:  normal sphincter tone, no lesions  Procedure:  Speculum placed.  Cervix cleansed with Betadine as IUD string could not be seen.  Single toothed tenaculum placed on anterior lip of cervix.  Using IUD hook, string was obtained and IUD removed without difficulty.  Tenaculum removed.  Pt tolerated procedure well.  Minimal spotting noted.  Speculum removed.  IUD discarded.  Chaperone was present for exam.  A:    Well Woman with normal exam  Mirena IUD removal today FSH confirmed menopause last  year  P:  Mammogram guidelines reviewed  Pap and HR HPV obtained today CBC, Vit D obtained Hep C obtained today Referral for colonoscopy made today Return annually or prn

## 2016-12-24 LAB — VITAMIN D 25 HYDROXY (VIT D DEFICIENCY, FRACTURES): Vit D, 25-Hydroxy: 23 ng/mL — ABNORMAL LOW (ref 30–100)

## 2016-12-25 LAB — IPS PAP TEST WITH HPV

## 2017-01-07 ENCOUNTER — Telehealth: Payer: Self-pay | Admitting: Obstetrics & Gynecology

## 2017-01-07 NOTE — Telephone Encounter (Signed)
Spoke with patient. Advised pap from 12/23/2016 is normal with negative HPV. Advised letter states follow up may be needed in case there are any other findings with her pap or if HPV returned abnormal with a normal pap. Advised again both results are normal and no follow up is needed. Patient verbalizes understanding.  Routing to provider for final review. Patient agreeable to disposition. Will close encounter.

## 2017-01-07 NOTE — Telephone Encounter (Signed)
Left message to call Kaitlyn at [phone].  Patient's pap from 12/23/2016 was normal with negative HPV.

## 2017-01-07 NOTE — Telephone Encounter (Signed)
Patient returning call.

## 2017-01-07 NOTE — Telephone Encounter (Signed)
Call to patient to relay referral information for colonoscopy scheduled 01-13-17. Patient understood and agreeable. While on the phone call, patient stated she received letter regarding pap results. She stated the result was negative, but mentioned possible follow up. Patient confused and agreeable to return call from nurse to discuss if results suggest follow up.

## 2017-06-09 ENCOUNTER — Other Ambulatory Visit: Payer: Self-pay | Admitting: Obstetrics & Gynecology

## 2017-06-09 DIAGNOSIS — Z1231 Encounter for screening mammogram for malignant neoplasm of breast: Secondary | ICD-10-CM

## 2017-06-23 ENCOUNTER — Ambulatory Visit
Admission: RE | Admit: 2017-06-23 | Discharge: 2017-06-23 | Disposition: A | Discharge status: Home / Self Care | Payer: BC Managed Care – PPO | Source: Ambulatory Visit | Attending: Obstetrics & Gynecology | Admitting: Obstetrics & Gynecology

## 2017-06-23 DIAGNOSIS — Z1231 Encounter for screening mammogram for malignant neoplasm of breast: Secondary | ICD-10-CM

## 2017-06-24 ENCOUNTER — Ambulatory Visit: Payer: BC Managed Care – PPO

## 2018-03-26 ENCOUNTER — Other Ambulatory Visit: Payer: Self-pay

## 2018-03-26 ENCOUNTER — Encounter: Payer: Self-pay | Admitting: Obstetrics & Gynecology

## 2018-03-26 ENCOUNTER — Ambulatory Visit: Payer: BC Managed Care – PPO | Admitting: Obstetrics & Gynecology

## 2018-03-26 VITALS — BP 110/70 | HR 76 | Resp 16 | Ht 62.0 in | Wt 179.0 lb

## 2018-03-26 DIAGNOSIS — Z0000 Encounter for general adult medical examination without abnormal findings: Secondary | ICD-10-CM | POA: Diagnosis not present

## 2018-03-26 DIAGNOSIS — Z01419 Encounter for gynecological examination (general) (routine) without abnormal findings: Secondary | ICD-10-CM | POA: Diagnosis not present

## 2018-03-26 NOTE — Progress Notes (Signed)
52 y.o. [id number] MarriedAsianF here for annual exam.  Doing well.  Frustrated with weight.  Had a muffin top.  Walking but not exercising really regularly.    Denies vaginal bleeding.    Patient's last menstrual period was 09/29/2013.          Sexually active: Yes.    The current method of family planning is post menopausal status.    Exercising: Yes.    walking Smoker:  no  Health Maintenance: Pap:  12/23/16 Neg. HR HPV:neg  History of abnormal Pap:  no MMG:  06/23/17 BIRADS1:neg  Colonoscopy:  Declines colonoscopy.  D/w pt cologuard this year. BMD:   Never TDaP:  2011 Pneumonia vaccine(s):  n/a Shingrix:   No Hep C testing: 12/23/16 Neg  Screening Labs: Here today    reports that she quit smoking about 4 years ago. She has never used smokeless tobacco. She reports that she drinks about 0.6 oz of alcohol per week. She reports that she does not use drugs.  Past Medical History:  Diagnosis Date  . Gallstone     Past Surgical History:  Procedure Laterality Date  . CHOLECYSTECTOMY  2/12   secondary to gallbladder polyp  . INTRAUTERINE DEVICE INSERTION     mirena insertion 12/25/10  . IUD REMOVAL     removal of paragard 10/31/10    Current Outpatient Medications  Medication Sig Dispense Refill  . clobetasol ointment (TEMOVATE) 0.05 % Apply 1 application topically 2 (two) times daily. Apply as directed twice daily 60 g 0  . naproxen sodium (ANAPROX) 220 MG tablet Take 220 mg by mouth 2 (two) times daily with a meal.    . terbinafine (LAMISIL AT) 1 % cream Apply 1 application topically 2 (two) times daily. X 14 days 30 g 0   No current facility-administered medications for this visit.     Family History  Problem Relation Age of Onset  . Hypertension Mother   . Diabetes Father   . Diabetes Paternal Grandmother     Review of Systems  All other systems reviewed and are negative.   Exam:   BP 110/70 (BP Location: Right Arm, Patient Position: Sitting, Cuff Size: Large)    Pulse 76   Resp 16   Ht 5\' 2"  (1.575 m)   Wt 179 lb (81.2 kg)   LMP 09/29/2013   BMI 32.74 kg/m    Height: 5\' 2"  (157.5 cm)  Ht Readings from Last 3 Encounters:  03/26/18 5\' 2"  (1.575 m)  12/23/16 5\' 2"  (1.575 m)  12/10/15 5' 2.25" (1.581 m)    General appearance: alert, cooperative and appears stated age Head: Normocephalic, without obvious abnormality, atraumatic Neck: no adenopathy, supple, symmetrical, trachea midline and thyroid normal to inspection and palpation Lungs: clear to auscultation bilaterally Breasts: normal appearance, no masses or tenderness Heart: regular rate and rhythm Abdomen: soft, non-tender; bowel sounds normal; no masses,  no organomegaly Extremities: extremities normal, atraumatic, no cyanosis or edema Skin: Skin color, texture, turgor normal. No rashes or lesions Lymph nodes: Cervical, supraclavicular, and axillary nodes normal. No abnormal inguinal nodes palpated Neurologic: Grossly normal   Pelvic: External genitalia:  no lesions              Urethra:  normal appearing urethra with no masses, tenderness or lesions              Bartholins and Skenes: normal                 Vagina: normal  appearing vagina with normal color and discharge, no lesions              Cervix: no lesions              Pap taken: No. Bimanual Exam:  Uterus:  normal size, contour, position, consistency, mobility, non-tender              Adnexa: normal adnexa and no mass, fullness, tenderness               Rectovaginal: Confirms               Anus:  normal sphincter tone, no lesions  Chaperone was present for exam.  A:  Well Woman with normal exam PMP, no HRT Weight gain  P:   Mammogram guidelines reviewed.  UTD. Colon cancer screening discussed.  Discussed colonoscopy.  Declines.  Willing to do cologuard.  Order placed. pap smear with neg HR HPV 2018.  Not obtained. Lab work will be obtained today.  CMP, Lipids, TSH (had toast this morning with coffee) Declines  shingrix vaccination Return annually or prn

## 2018-03-27 LAB — COMPREHENSIVE METABOLIC PANEL
ALT: 22 IU/L (ref 0–32)
AST: 21 IU/L (ref 0–40)
Albumin/Globulin Ratio: 1.3 (ref 1.2–2.2)
Albumin: 4.3 g/dL (ref 3.5–5.5)
Alkaline Phosphatase: 144 IU/L — ABNORMAL HIGH (ref 39–117)
BUN/Creatinine Ratio: 13 (ref 9–23)
BUN: 12 mg/dL (ref 6–24)
Bilirubin Total: 0.4 mg/dL (ref 0.0–1.2)
CALCIUM: 9.8 mg/dL (ref 8.7–10.2)
CO2: 26 mmol/L (ref 20–29)
CREATININE: 0.93 mg/dL (ref 0.57–1.00)
Chloride: 102 mmol/L (ref 96–106)
GFR calc Af Amer: 81 mL/min/{1.73_m2} (ref >59)
GFR, EST NON AFRICAN AMERICAN: 70 mL/min/{1.73_m2} (ref >59)
GLOBULIN, TOTAL: 3.3 g/dL (ref 1.5–4.5)
GLUCOSE: 81 mg/dL (ref 65–99)
Potassium: 4.5 mmol/L (ref 3.5–5.2)
Sodium: 143 mmol/L (ref 134–144)
Total Protein: 7.6 g/dL (ref 6.0–8.5)

## 2018-03-27 LAB — LIPID PANEL
CHOL/HDL RATIO: 3.5 ratio (ref 0.0–4.4)
CHOLESTEROL TOTAL: 176 mg/dL (ref 100–199)
HDL: 50 mg/dL (ref >39)
LDL CALC: 110 mg/dL — AB (ref 0–99)
Triglycerides: 82 mg/dL (ref 0–149)
VLDL CHOLESTEROL CAL: 16 mg/dL (ref 5–40)

## 2018-03-27 LAB — TSH: TSH: 3.01 u[IU]/mL (ref 0.450–4.500)

## 2018-04-05 ENCOUNTER — Telehealth: Payer: Self-pay | Admitting: *Deleted

## 2018-04-05 NOTE — Telephone Encounter (Signed)
LMTCB

## 2018-04-05 NOTE — Telephone Encounter (Signed)
-----   Message from Jerene Bears, MD sent at 04/01/2018  6:10 PM EDT ----- Please let pt know her lipid panel showed a mildly elevated LDL.  This is ok to watch.  Her metabolic panel showed and elevated alkaline phosphatase.  This is a gall bladder function test.  It should be repeated in one month.  Please have her repeat this test fasting.  Thanks.  Order has not been placed.

## 2018-04-07 NOTE — Telephone Encounter (Signed)
Notes recorded by Loreta AveMorales, Reina C, CMA on 04/07/2018 at 1:36 PM EDT Pt notified. Verbalized understanding. States she had her gallbladder removed. Do you still want to repeat the test? Patient ok to wait for Dr. Hyacinth MeekerMiller next week.

## 2018-04-07 NOTE — Telephone Encounter (Signed)
Return call to Reina °

## 2018-04-13 ENCOUNTER — Other Ambulatory Visit: Payer: Self-pay | Admitting: Obstetrics & Gynecology

## 2018-04-13 DIAGNOSIS — R748 Abnormal levels of other serum enzymes: Secondary | ICD-10-CM

## 2018-04-27 ENCOUNTER — Telehealth: Payer: Self-pay | Admitting: *Deleted

## 2018-04-27 DIAGNOSIS — Z1211 Encounter for screening for malignant neoplasm of colon: Secondary | ICD-10-CM

## 2018-04-27 LAB — COLOGUARD: Cologuard: NEGATIVE

## 2018-04-27 NOTE — Telephone Encounter (Signed)
Patient notified of negative Cologuard results and verbalized understanding.   RPatient agreeable to disposition. Will close encounter.

## 2018-05-04 ENCOUNTER — Other Ambulatory Visit (INDEPENDENT_AMBULATORY_CARE_PROVIDER_SITE_OTHER): Payer: BC Managed Care – PPO

## 2018-05-04 DIAGNOSIS — R748 Abnormal levels of other serum enzymes: Secondary | ICD-10-CM

## 2018-05-05 LAB — GAMMA GT: GGT: 25 IU/L (ref 0–60)

## 2018-05-05 LAB — ALKALINE PHOSPHATASE: Alkaline Phosphatase: 128 IU/L — ABNORMAL HIGH (ref 39–117)

## 2018-05-13 ENCOUNTER — Other Ambulatory Visit: Payer: Self-pay | Admitting: *Deleted

## 2018-05-13 DIAGNOSIS — R748 Abnormal levels of other serum enzymes: Secondary | ICD-10-CM

## 2018-05-27 ENCOUNTER — Telehealth: Payer: Self-pay | Admitting: *Deleted

## 2018-05-27 NOTE — Telephone Encounter (Signed)
-----  Message from Megan Salon, MD sent at 05/10/2018 11:09 PM EDT ----- Please let pt know her repeat alk phos was still elevated.  GGT was normal showing this is likely not from liver or gall bladder.  There are some bone issues that can cause this to be elevated.  This is mildly elevated but I'd still like her to see endocrinology for this. Can you please call endo and make sure they will accept this referral before calling the pt?  My preference would be Dr. Cruzita Lederer.  Thanks.

## 2018-05-27 NOTE — Telephone Encounter (Signed)
Notes recorded by Leda MinHamm, Brelyn Woehl N, RN on 05/27/2018 at 11:13 AM EDT Left message to call Noreene LarssonJill at [phone].  Per review of referral in Epic, ok to schedule with Dr. Elvera LennoxGherghe. ------  Notes recorded by Leda MinHamm, Shaketha Jeon N, RN on 05/13/2018 at 3:48 PM EDT Sp[facility] with Elease [facility]Patricia [facility]. Was advised to place referral, will review with Dr. Elvera LennoxGherghe , can return call to office once reviewed.  Reviewed with Dr. Hyacinth MeekerMiller, advised as seen above, agreeable with plan. Order placed in Epic for referral to endocrinology. ------

## 2018-05-28 NOTE — Telephone Encounter (Signed)
Spoke with patient, advised as seen below per Dr. Hyacinth MeekerMiller. Patient verbalizes understanding and request to proceed with referral. Advised she will be contacted by our office referral coordinator with appointment details. Patient verbalizes understanding and is agreeable.   Routing to [place] for scheduling. Encounter closed.   Cc: Dr. Hyacinth MeekerMiller

## 2018-06-16 ENCOUNTER — Other Ambulatory Visit: Payer: Self-pay | Admitting: Obstetrics & Gynecology

## 2018-06-16 DIAGNOSIS — Z1231 Encounter for screening mammogram for malignant neoplasm of breast: Secondary | ICD-10-CM

## 2018-06-25 ENCOUNTER — Encounter: Payer: Self-pay | Admitting: Internal Medicine

## 2018-06-25 ENCOUNTER — Ambulatory Visit (INDEPENDENT_AMBULATORY_CARE_PROVIDER_SITE_OTHER): Payer: BC Managed Care – PPO | Admitting: Internal Medicine

## 2018-06-25 VITALS — BP 98/73 | HR 74 | Ht 62.0 in | Wt 178.2 lb

## 2018-06-25 DIAGNOSIS — R748 Abnormal levels of other serum enzymes: Secondary | ICD-10-CM | POA: Diagnosis not present

## 2018-06-25 DIAGNOSIS — E559 Vitamin D deficiency, unspecified: Secondary | ICD-10-CM | POA: Diagnosis not present

## 2018-06-25 LAB — VITAMIN D 25 HYDROXY (VIT D DEFICIENCY, FRACTURES): VITD: 13.93 ng/mL — ABNORMAL LOW (ref 30.00–100.00)

## 2018-06-25 NOTE — Progress Notes (Signed)
Patient ID: Felicia Gill, female   DOB: 11/13/1966, 52 y.o.   MRN: [id number]    HPI  Felicia Gill is a 52 y.o.-year-old female, referred by her ObGyn, Dr. Megan Salon, for evaluation for elevated alkaline phosphatase.  Lab Results  Component Value Date   ALKPHOS 128 (H) 05/04/2018   ALKPHOS 144 (H) 03/26/2018   ALKPHOS 104 07/27/2015   ALKPHOS 116 07/08/2013   ALKPHOS 109 02/03/2011   ALKPHOS 75 12/30/2008  2009: Alk Phos 81; AST 118, ALT 97 - she was then hospitalized for a severe drug rxn to Bactrim  GGT and LFTs were normal: Component     Latest Ref Rng & Units 03/26/2018 05/04/2018  AST     0 - 40 IU/L 21   ALT     0 - 32 IU/L 22   GGT     0 - 60 IU/L  25   + history of vitamin D insufficiency: Lab Results  Component Value Date   VD25OH 23 (L) 12/23/2016  She is not taking vitamin D.  No history of hypo-or hypercalcemia or hyperparathyroidism:  Lab Results  Component Value Date   CALCIUM 9.8 03/26/2018   CALCIUM 9.4 07/27/2015   CALCIUM 9.8 07/08/2013   CALCIUM 9.6 02/03/2011   CALCIUM 8.7 01/01/2009   CALCIUM 8.6 12/31/2008   CALCIUM 8.4 12/30/2008   No history of fractures or osteoporosis.  Also, no history of: -Cancer -liver disease including chronic hepatitis -Alcoholism -Liver infiltrative diseases like sarcoidosis, lymphoma, amyloidosis, TB -Heart failure -PUD -Celiac disease  No history of diabetes: No results found for: HGBA1C   No history of thyroid disease: Lab Results  Component Value Date   TSH 3.010 03/26/2018   TSH 2.334 07/27/2015   TSH 2.528 07/08/2013   No history of CKD.  Last BUN/creatinine: Lab Results  Component Value Date   BUN 12 03/26/2018   BUN 12 07/27/2015   CREATININE 0.93 03/26/2018   CREATININE 0.82 07/27/2015   ROS: Constitutional: + weight gain, no fatigue, + hot flushes Eyes: no blurry vision, no xerophthalmia ENT: no sore throat, no nodules palpated in throat, no dysphagia/odynophagia, no  hoarseness Cardiovascular: no CP/SOB/palpitations/leg swelling Respiratory: no cough/SOB Gastrointestinal: no N/V/D/C/+ heartburn Musculoskeletal: no muscle/joint aches Skin: no rashes Neurological: no tremors/+ numbness and  Tingling - in B hands and also occas. R toe/no dizziness Psychiatric: no depression/anxiety  Past Medical History:  Diagnosis Date  . Gallstone    Past Surgical History:  Procedure Laterality Date  . CHOLECYSTECTOMY  2/12   secondary to gallbladder polyp  . INTRAUTERINE DEVICE INSERTION     mirena insertion 12/25/10  . IUD REMOVAL     removal of paragard 10/31/10   Social History   Socioeconomic History  . Marital status: Married    Spouse name: Not on file  . Number of children: 3 boys  . Years of education: Not on file  . Highest education level: Not on file  Occupational History  . 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 Smoking status: Former Smoker    Last attempt to quit: 2018    Years since quitting:   . Smokeless tobacco: Never Used  .   Substance and Sexual Activity  . Alcohol use: Yes    Alcohol/week: 0.6 oz    Types: 1 Glasses of wine q 2 weeks  . Drug use: No   Current Outpatient Medications on File Prior to Visit  Medication Sig Dispense Refill  . clobetasol ointment (  TEMOVATE) 9.16 % Apply 1 application topically 2 (two) times daily. Apply as directed twice daily (Patient not taking: Reported on 06/25/2018) 60 g 0  . naproxen sodium (ANAPROX) 220 MG tablet Take 220 mg by mouth 2 (two) times daily with a meal.    . terbinafine (LAMISIL AT) 1 % cream Apply 1 application topically 2 (two) times daily. X 14 days (Patient not taking: Reported on 06/25/2018) 30 g 0   No current facility-administered medications on file prior to visit.    Allergies  Allergen Reactions  . Bactrim [Sulfamethoxazole-Trimethoprim] Shortness Of Breath  . Sulfa Antibiotics Anaphylaxis   Family History  Problem Relation Age of Onset  . Hypertension Mother    . Diabetes Father   . Diabetes Paternal Grandmother   ALso, - father with heart ds.  PE: BP 98/73   Pulse 74   Ht 5' 2"  (1.575 m)   Wt 178 lb 3.2 oz (80.8 kg)   LMP 09/29/2013   SpO2 98%   BMI 32.59 kg/m  Wt Readings from Last 3 Encounters:  06/25/18 178 lb 3.2 oz (80.8 kg)  03/26/18 179 lb (81.2 kg)  12/23/16 175 lb (79.4 kg)   Constitutional: overweight, in NAD. No kyphosis. Eyes: PERRLA, EOMI, no exophthalmos ENT: moist mucous membranes, no thyromegaly, no cervical lymphadenopathy Cardiovascular: RRR, No MRG Respiratory: CTA B Gastrointestinal: abdomen soft, NT, ND, BS+ Musculoskeletal: no deformities, strength intact in all 4 Skin: moist, warm, no rashes Neurological: no tremor with outstretched hands, DTR normal in all 4  Assessment: 1. Elevated Alkaline phosphatase  2. Vitamin D insufficiency  PLAN: Patient with 2 elevated alkaline phosphatase levels recently.  Otherwise, levels dating back to 2010 have been normal. Of note, her GGT and LFTs were normal, therefore, the Aphos is most likely originating in bone, rather than liver. I do not feel we need speciation for now.  - We discussed about bone conditions associated with high alkaline phosphatase:  Paget's disease (but she has no symptoms)  Bone metastases or osteosarcoma (no history of cancer, no symptoms)  Bone fractures (no history of regular fractures or stress fractures)  Vitamin D deficiency with osteomalacia (+ history of vitamin D insufficiency >> will recheck vit D level today)  Hyperparathyroidism (no PTH checked but calcium levels have always been normal)  Hyperthyroid bone disease (TSH normal at every check) - We also discussed that a false can be higher after a meal so we will need to check her level fasting - I will wait for the results of her vitamin D. If low >> will replete and check both vitamin D and a repeat, fasting, Aphos in 2 mo. If unexplained elevated of alkaline phosphatase, she  will need a bone scan to screen for Paget's disease.  We discussed that this is a disease in which the bone growth is dysregulated and is susceptible to fractures and also to nerve compression from the hypertrophied bones. - I will see the patient back in  1 year  2. Vitamin D insufficiency - reviewed latest vitamin D level >> low 1.5 years ago - she is not on a vitamin D supplement - discussed about the mechanism by which a low vit D level can impact bone quality and strength - will recheck level today  Component     Latest Ref Rng & Units 06/25/2018  VITD     30.00 - 100.00 ng/mL 13.93 (L)   Vitamin D is very low.  We will start 5000 units vitamin D  daily for back in 2 months for another vitamin D level check along with an alkaline phosphatase. Philemon Kingdom, MD PhD [facility]

## 2018-06-25 NOTE — Patient Instructions (Signed)
Please stop at the lab.  Please come back for a follow-up appointment in 6 months.  

## 2018-07-05 ENCOUNTER — Ambulatory Visit
Admission: RE | Admit: 2018-07-05 | Discharge: 2018-07-05 | Disposition: A | Discharge status: Home / Self Care | Payer: BC Managed Care – PPO | Source: Ambulatory Visit | Attending: Obstetrics & Gynecology | Admitting: Obstetrics & Gynecology

## 2018-07-05 DIAGNOSIS — Z1231 Encounter for screening mammogram for malignant neoplasm of breast: Secondary | ICD-10-CM

## 2018-08-10 ENCOUNTER — Encounter: Payer: Self-pay | Admitting: Obstetrics & Gynecology

## 2018-12-31 ENCOUNTER — Encounter: Payer: Self-pay | Admitting: Internal Medicine

## 2018-12-31 ENCOUNTER — Ambulatory Visit: Payer: BC Managed Care – PPO | Admitting: Internal Medicine

## 2018-12-31 VITALS — BP 110/60 | HR 64 | Ht 62.0 in | Wt 180.0 lb

## 2018-12-31 DIAGNOSIS — R748 Abnormal levels of other serum enzymes: Secondary | ICD-10-CM | POA: Diagnosis not present

## 2018-12-31 DIAGNOSIS — E559 Vitamin D deficiency, unspecified: Secondary | ICD-10-CM

## 2018-12-31 LAB — VITAMIN D 25 HYDROXY (VIT D DEFICIENCY, FRACTURES): VITD: 21.04 ng/mL — AB (ref 30.00–100.00)

## 2018-12-31 LAB — ALKALINE PHOSPHATASE: ALK PHOS: 127 U/L — AB (ref 39–117)

## 2018-12-31 NOTE — Patient Instructions (Addendum)
Please stop at the lab.  Please continue vitamin D 5000 units daily.  Return to see me as needed.

## 2018-12-31 NOTE — Progress Notes (Signed)
Patient ID: Felicia Gill, female   DOB: Jan 18, 1966, 53 y.o.   MRN: [id number]    HPI  Felicia Gill is a 53 y.o.-year-old female, referred by her ObGyn, Dr. Megan Salon, for evaluation for elevated alkaline phosphatase and vitamin D deficiency.  Last visit 6 months ago.  At last visit we diagnosed vitamin D deficiency and we discussed that this can be a cause for high alkaline phosphatase.  We started vitamin D supplementation and I advised her to return for another vitamin D level along with an alkaline phosphatase level.  She did not return for these labs...  Reviewed levels: Lab Results  Component Value Date   ALKPHOS 128 (H) 05/04/2018   ALKPHOS 144 (H) 03/26/2018   ALKPHOS 104 07/27/2015   ALKPHOS 116 07/08/2013   ALKPHOS 109 02/03/2011   ALKPHOS 75 12/30/2008  2009: Alk Phos 81; AST 118, ALT 97 - she was then hospitalized for a severe drug rxn to Bactrim  GGT and LFTs were normal: Component     Latest Ref Rng & Units 03/26/2018 05/04/2018  AST     0 - 40 IU/L 21   ALT     0 - 32 IU/L 22   GGT     0 - 60 IU/L  25   She has vitamin D deficiency per last visit check: Lab Results  Component Value Date   VD25OH 13.93 (L) 06/25/2018   VD25OH 23 (L) 12/23/2016   We started 5000 units vitamin D daily.  No history of hypo-or hypercalcemia or hyperparathyroidism: Lab Results  Component Value Date   CALCIUM 9.8 03/26/2018   CALCIUM 9.4 07/27/2015   CALCIUM 9.8 07/08/2013   CALCIUM 9.6 02/03/2011   CALCIUM 8.7 01/01/2009   CALCIUM 8.6 12/31/2008   CALCIUM 8.4 12/30/2008   No history of osteoporosis or fractures.  She has no history of -Cancer -Chronic liver disease including hepatitis -Alcoholism -Liver infiltrative diseases like sarcoidosis, lymphoma, amyloidosis, TB -CHF -PUD -Celiac disease -DM2 No results found for: HGBA1C   No thyroid disease: Lab Results  Component Value Date   TSH 3.010 03/26/2018   TSH 2.334 07/27/2015   TSH 2.528 07/08/2013   No  CKD.  Last BUN/creatinine: Lab Results  Component Value Date   BUN 12 03/26/2018   BUN 12 07/27/2015   CREATININE 0.93 03/26/2018   CREATININE 0.82 07/27/2015   ROS: Constitutional: no weight gain/no weight loss, no fatigue, no subjective hyperthermia, no subjective hypothermia Eyes: no blurry vision, no xerophthalmia ENT: no sore throat, no nodules palpated in neck, no dysphagia, no odynophagia, no hoarseness Cardiovascular: no CP/no SOB/no palpitations/no leg swelling Respiratory: no cough/no SOB/no wheezing Gastrointestinal: no N/no V/no D/no C/no acid reflux Musculoskeletal: no muscle aches/no joint aches Skin: no rashes, no hair loss Neurological: no tremors/no numbness/no tingling/no dizziness  I reviewed pt's medications, allergies, PMH, social hx, family hx, and changes were documented in the history of present illness. Otherwise, unchanged from my initial visit note.   Past Medical History:  Diagnosis Date  . Gallstone    Past Surgical History:  Procedure Laterality Date  . CHOLECYSTECTOMY  2/12   secondary to gallbladder polyp  . INTRAUTERINE DEVICE INSERTION     mirena insertion 12/25/10  . IUD REMOVAL     removal of paragard 10/31/10   Social History   Socioeconomic History  . Marital status: Married    Spouse name: Not on file  . Number of children: 3 boys  . Years of education:  Not on file  . Highest education level: Not on file  Occupational History  . 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 Smoking status: Former Smoker    Last attempt to quit: 2018    Years since quitting:   . Smokeless tobacco: Never Used  .   Substance and Sexual Activity  . Alcohol use: Yes    Alcohol/week: 0.6 oz    Types: 1 Glasses of wine q 2 weeks  . Drug use: No   Current Outpatient Medications on File Prior to Visit  Medication Sig Dispense Refill  . clobetasol ointment (TEMOVATE) 5.78 % Apply 1 application topically 2 (two) times daily. Apply as directed twice daily  (Patient not taking: Reported on 06/25/2018) 60 g 0  . naproxen sodium (ANAPROX) 220 MG tablet Take 220 mg by mouth 2 (two) times daily with a meal.    . terbinafine (LAMISIL AT) 1 % cream Apply 1 application topically 2 (two) times daily. X 14 days (Patient not taking: Reported on 06/25/2018) 30 g 0   No current facility-administered medications on file prior to visit.    Allergies  Allergen Reactions  . Bactrim [Sulfamethoxazole-Trimethoprim] Shortness Of Breath  . Sulfa Antibiotics Anaphylaxis   Family History  Problem Relation Age of Onset  . Hypertension Mother   . Diabetes Father   . Diabetes Paternal Grandmother   ALso, - father with heart ds.  PE: BP 110/60   Pulse 64   Ht 5' 2"  (1.575 m) Comment: measured  Wt 180 lb (81.6 kg)   LMP 09/29/2013   SpO2 98%   BMI 32.92 kg/m .  Wt Readings from Last 3 Encounters:  12/31/18 180 lb (81.6 kg)  06/25/18 178 lb 3.2 oz (80.8 kg)  03/26/18 179 lb (81.2 kg)   Constitutional: overweight, in NAD Eyes: PERRLA, EOMI, no exophthalmos ENT: moist mucous membranes, no thyromegaly, no cervical lymphadenopathy Cardiovascular: RRR, No MRG Respiratory: CTA B Gastrointestinal: abdomen soft, NT, ND, BS+ Musculoskeletal: no deformities, strength intact in all 4 Skin: moist, warm, no rashes Neurological: no tremor with outstretched hands, DTR normal in all 4  Assessment: 1. Elevated Alkaline phosphatase  2. Vitamin D insufficiency  PLAN: Patient with history of elevated alkaline phosphatase x2, with previously normal levels dating back to 2010.  Of note, her GGT and LFTs were normal, therefore, her alkaline phosphatase is most likely originating from the bone.  I do not feel we need speciation for now. -At last visit we reviewed bone conditions associated with a high alkaline phosphatase:  Paget's disease (but she has no symptoms)  Bone metastases or osteosarcoma (no history of cancer, no symptoms)  Bone fractures (no history of  regular or stress fractures)  Vitamin D deficiency with osteomalacia (she had a history of vitamin deficiency - we checked another level at last visit and this was very low.  We started replacement.)  Hyperparathyroidism (calcium levels have been normal)  Hyperthyroid bone disease (TSH has been normal) -We also discussed that alkaline phosphatase can be falsely high after meals.  We will need to check this fasting in the future. -We will check another vitamin D level today.  If this normalizes while APhos remains high, we may need a bone can to screen for Paget's disease - I will see the patient back if alkaline phosphatase level remains high  2. Vitamin D insufficiency -Patient's vitamin D level from last visit was very low -We discussed that the low vitamin D level can increase her alkaline  phosphatase, and also impact bone quality and strength -I advised her to start 5000 units vitamin D daily - forgets it once a week. -We will check another level today  Will see her back only if her labs are abnormal  - per her request.  Component     Latest Ref Rng & Units 12/31/2018  Alkaline Phosphatase     39 - 117 U/L 127 (H)  VITD     30.00 - 100.00 ng/mL 21.04 (L)   Vit D is still low - noncompliance with vit D level? Will again check with her.  Philemon Kingdom, MD PhD [facility]

## 2019-06-07 ENCOUNTER — Ambulatory Visit: Payer: BC Managed Care – PPO | Admitting: Obstetrics & Gynecology

## 2019-06-07 NOTE — Progress Notes (Deleted)
53 y.o. G31P3003 Married Other or two or more races female here for annual exam.    Patient's last menstrual period was 09/29/2013.          Sexually active: {yes no:314532}  The current method of family planning is post menopausal status.    Exercising: {yes no:314532}  {types:19826} Smoker:  {YES [id number]  Health Maintenance: Pap:  12/23/16 neg. HR HPV:neg   07/10/14 neg History of abnormal Pap:  no MMG:  07/05/18 BIRADS1:neg  Colonoscopy:  04/07/18 Cologuard Neg  BMD:   *** TDaP:  2011 Pneumonia vaccine(s):  n/a Shingrix:   *** Hep C testing: 12/23/16 neg  Screening Labs: ***   reports that she quit smoking about 5 years ago. She has never used smokeless tobacco. She reports current alcohol use of about 1.0 standard drinks of alcohol per week. She reports that she does not use drugs.  Past Medical History:  Diagnosis Date  . Gallstone     Past Surgical History:  Procedure Laterality Date  . CHOLECYSTECTOMY  2/12   secondary to gallbladder polyp  . INTRAUTERINE DEVICE INSERTION     mirena insertion 12/25/10  . IUD REMOVAL     removal of paragard 10/31/10    Current Outpatient Medications  Medication Sig Dispense Refill  . clobetasol ointment (TEMOVATE) 2.42 % Apply 1 application topically 2 (two) times daily. Apply as directed twice daily (Patient not taking: Reported on 06/25/2018) 60 g 0  . naproxen sodium (ANAPROX) 220 MG tablet Take 220 mg by mouth 2 (two) times daily with a meal.    . terbinafine (LAMISIL AT) 1 % cream Apply 1 application topically 2 (two) times daily. X 14 days (Patient not taking: Reported on 06/25/2018) 30 g 0   No current facility-administered medications for this visit.     Family History  Problem Relation Age of Onset  . Hypertension Mother   . Diabetes Father   . Diabetes Paternal Grandmother     Review of Systems  Exam:   LMP 09/29/2013   Height:      Ht Readings from Last 3 Encounters:  12/31/18 5\' 2"  (1.575 m)  06/25/18 5\' 2"  (1.575  m)  03/26/18 5\' 2"  (1.575 m)    General appearance: alert, cooperative and appears stated age Head: Normocephalic, without obvious abnormality, atraumatic Neck: no adenopathy, supple, symmetrical, trachea midline and thyroid {EXAM; THYROID:18604} Lungs: clear to auscultation bilaterally Breasts: {Exam; breast:13139::"normal appearance, no masses or tenderness"} Heart: regular rate and rhythm Abdomen: soft, non-tender; bowel sounds normal; no masses,  no organomegaly Extremities: extremities normal, atraumatic, no cyanosis or edema Skin: Skin color, texture, turgor normal. No rashes or lesions Lymph nodes: Cervical, supraclavicular, and axillary nodes normal. No abnormal inguinal nodes palpated Neurologic: Grossly normal   Pelvic: External genitalia:  no lesions              Urethra:  normal appearing urethra with no masses, tenderness or lesions              Bartholins and Skenes: normal                 Vagina: normal appearing vagina with normal color and discharge, no lesions              Cervix: {exam; cervix:14595}              Pap taken: {yes no:314532} Bimanual Exam:  Uterus:  {exam; uterus:12215}  Adnexa: {exam; adnexa:12223}               Rectovaginal: Confirms               Anus:  normal sphincter tone, no lesions  Chaperone was present for exam.  A:  Well Woman with normal exam  P:   {plan; gyn:5269::"mammogram","pap smear","return annually or prn"}

## 2019-08-25 ENCOUNTER — Other Ambulatory Visit: Payer: Self-pay

## 2019-08-26 NOTE — Progress Notes (Signed)
53 y.o. [id number] Married Other or two or more races female here for annual exam.  Had Covid exposure this summer (brother-in-law).  Had testing that was negative.    Had elevated alk phos and saw Dr. Cruzita Lederer.  Has Vit D deficiency.  Alk phos was decreased some in January.  Denies vaginal bleeding.    Patient's last menstrual period was 09/29/2013.          Sexually active: Yes.    The current method of family planning is post menopausal status.    Exercising: No.  Smoker:  One cigarette daily  Health Maintenance: Pap:  12/23/2016 Negative, HPV Negative History of abnormal Pap:  no MMG:  07/05/2018 BIRADS 1 Negative Density C Colonoscopy:  April 2019 BMD:   No TDaP:  2011 Pneumonia vaccine(s):  No Shingrix:  Declines Hep C testing: 12/23/2016 Screening Labs: PCP   reports that she has been smoking cigarettes. She has never used smokeless tobacco. She reports current alcohol use of about 1.0 standard drinks of alcohol per week. She reports that she does not use drugs.  Past Medical History:  Diagnosis Date  . Gallstone     Past Surgical History:  Procedure Laterality Date  . CHOLECYSTECTOMY  2/12   secondary to gallbladder polyp  . INTRAUTERINE DEVICE INSERTION     mirena insertion 12/25/10  . IUD REMOVAL     removal of paragard 10/31/10    Current Outpatient Medications  Medication Sig Dispense Refill  . clobetasol ointment (TEMOVATE) 1.06 % Apply 1 application topically 2 (two) times daily. Apply as directed twice daily 60 g 0  . terbinafine (LAMISIL AT) 1 % cream Apply 1 application topically 2 (two) times daily. X 14 days 30 g 0   No current facility-administered medications for this visit.     Family History  Problem Relation Age of Onset  . Hypertension Mother   . Diabetes Father   . Diabetes Paternal Grandmother     Review of Systems  Constitutional: Negative.   HENT: Negative.   Eyes: Negative.   Respiratory: Negative.   Cardiovascular: Negative.    Gastrointestinal: Negative.   Endocrine: Negative.   Genitourinary: Negative.   Musculoskeletal: Negative.   Skin: Negative.   Allergic/Immunologic: Negative.   Neurological: Negative.   Hematological: Negative.   Psychiatric/Behavioral: Negative.     Exam:   Vitals:   08/29/19 1529  BP: 118/74  Pulse: 84  Temp: 97.7 F (36.5 C)    General appearance: alert, cooperative and appears stated age Head: Normocephalic, without obvious abnormality, atraumatic Neck: no adenopathy, supple, symmetrical, trachea midline and thyroid normal to inspection and palpation Lungs: clear to auscultation bilaterally Breasts: normal appearance, no masses or tenderness Heart: regular rate and rhythm Abdomen: soft, non-tender; bowel sounds normal; no masses,  no organomegaly Extremities: extremities normal, atraumatic, no cyanosis or edema Skin: Skin color, texture, turgor normal. No rashes or lesions Lymph nodes: Cervical, supraclavicular, and axillary nodes normal. No abnormal inguinal nodes palpated Neurologic: Grossly normal   Pelvic: External genitalia:  no lesions              Urethra:  normal appearing urethra with no masses, tenderness or lesions              Bartholins and Skenes: normal                 Vagina: normal appearing vagina with normal color and discharge, no lesions  Cervix: no lesions              Pap taken: No. Bimanual Exam:  Uterus:  normal size, contour, position, consistency, mobility, non-tender              Adnexa: normal adnexa and no mass, fullness, tenderness               Rectovaginal: Confirms               Anus:  normal sphincter tone, no lesions  Chaperone was present for exam.  A:  Well Woman with normal exam PMP, no HRT Elevated Alk phos, followed by Dr. Rich Brave D deficiency  P:   Mammogram guidelines reviewed.  Aware this is due. pap smear with neg HR HPV 2018.  Not indicated today. Blood work done 2018 and 2019.  Will plan  fasting blood work next year Cologuard neg 2019 Return annually or prn

## 2019-08-29 ENCOUNTER — Ambulatory Visit: Payer: BC Managed Care – PPO | Admitting: Obstetrics & Gynecology

## 2019-08-29 ENCOUNTER — Other Ambulatory Visit: Payer: Self-pay

## 2019-08-29 ENCOUNTER — Encounter: Payer: Self-pay | Admitting: Obstetrics & Gynecology

## 2019-08-29 ENCOUNTER — Encounter

## 2019-08-29 VITALS — BP 118/74 | HR 84 | Temp 97.7°F | Ht 61.75 in | Wt 182.8 lb

## 2019-08-29 DIAGNOSIS — Z01419 Encounter for gynecological examination (general) (routine) without abnormal findings: Secondary | ICD-10-CM

## 2020-01-12 ENCOUNTER — Encounter: Payer: Self-pay | Admitting: Podiatry

## 2020-01-12 ENCOUNTER — Ambulatory Visit: Payer: BC Managed Care – PPO | Admitting: Podiatry

## 2020-01-12 ENCOUNTER — Ambulatory Visit (INDEPENDENT_AMBULATORY_CARE_PROVIDER_SITE_OTHER): Payer: BC Managed Care – PPO

## 2020-01-12 ENCOUNTER — Other Ambulatory Visit: Payer: Self-pay

## 2020-01-12 VITALS — BP 111/66 | HR 57 | Resp 16

## 2020-01-12 DIAGNOSIS — M722 Plantar fascial fibromatosis: Secondary | ICD-10-CM

## 2020-01-12 MED ORDER — MELOXICAM 15 MG PO TABS: 15.0000 mg | ORAL_TABLET | Freq: Every day | ORAL | 3 refills | Status: AC

## 2020-01-12 MED ORDER — METHYLPREDNISOLONE 4 MG PO TBPK: ORAL_TABLET | ORAL | 0 refills | Status: AC

## 2020-01-12 NOTE — Progress Notes (Signed)
  Subjective:  Patient ID: Felicia Gill, female    DOB: 1966/02/03,  MRN: [id number] HPI Chief Complaint  Patient presents with  . Foot Pain    Plantar heel left - aching x 4 years, was more intermittent, but now getting sharp pains, AM pain, wearing OTC insoles, trying to exercies more but unable  . New Patient (Initial Visit)    54 y.o. female presents with the above complaint.   ROS: Denies fever chills nausea vomiting muscle aches pains calf pain back pain chest pain shortness of breath.  Past Medical History:  Diagnosis Date  . Gallstone    Past Surgical History:  Procedure Laterality Date  . CHOLECYSTECTOMY  2/12   secondary to gallbladder polyp  . INTRAUTERINE DEVICE INSERTION     mirena insertion 12/25/10  . IUD REMOVAL     removal of paragard 10/31/10    Current Outpatient Medications:  Marland Kitchen  Vitamins/Minerals TABS, Take by mouth., Disp: , Rfl:  .  meloxicam (MOBIC) 15 MG tablet, Take 1 tablet (15 mg total) by mouth daily., Disp: 30 tablet, Rfl: 3 .  methylPREDNISolone (MEDROL DOSEPAK) 4 MG TBPK tablet, 6 day dose pack - take as directed, Disp: 21 tablet, Rfl: 0  Allergies  Allergen Reactions  . Bactrim [Sulfamethoxazole-Trimethoprim] Shortness Of Breath  . Sulfa Antibiotics Anaphylaxis   Review of Systems Objective:   Vitals:   01/12/20 0946  BP: 111/66  Pulse: (!) 57  Resp: 16    General: Well developed, nourished, in no acute distress, alert and oriented x3   Dermatological: Skin is warm, dry and supple bilateral. Nails x 10 are well maintained; remaining integument appears unremarkable at this time. There are no open sores, no preulcerative lesions, no rash or signs of infection present.  Vascular: Dorsalis Pedis artery and Posterior Tibial artery pedal pulses are 2/4 bilateral with immedate capillary fill time. Pedal hair growth present. No varicosities and no lower extremity edema present bilateral.   Neruologic: Grossly intact via light touch  bilateral. Vibratory intact via tuning fork bilateral. Protective threshold with Semmes Wienstein monofilament intact to all pedal sites bilateral. Patellar and Achilles deep tendon reflexes 2+ bilateral. No Babinski or clonus noted bilateral.   Musculoskeletal: No gross boney pedal deformities bilateral. No pain, crepitus, or limitation noted with foot and ankle range of motion bilateral. Muscular strength 5/5 in all groups tested bilateral.  Pain on palpation medial kidney tubercle left heel.  Gait: Unassisted, Nonantalgic.    Radiographs:  Radiographs taken today demonstrate no acute abnormalities.  Mild bunion deformity soft tissue increase in density plantar fashion calcaneal insertion site of the left heel.    Assessment & Plan:   Assessment: Plantar fasciitis left heel.  Plan: Discussed etiology pathology conservative surgical therapies at this point started her on Medrol Dosepak to be followed by meloxicam.  Injected the left heel 20 mg Kenalog 5 mg Marcaine for maximal tenderness.  Clear implant fascial brace and a night splint discussed appropriate shoe gear stretching exercises ice therapy and shoe gear modifications.  We will follow-up with her in 1 month.     Grainger Mccarley T. [place], [place]

## 2020-01-12 NOTE — Patient Instructions (Signed)

## 2020-02-09 ENCOUNTER — Ambulatory Visit: Payer: BC Managed Care – PPO | Admitting: Podiatry

## 2020-02-11 ENCOUNTER — Ambulatory Visit: Payer: BC Managed Care – PPO

## 2020-03-27 ENCOUNTER — Other Ambulatory Visit: Payer: Self-pay | Admitting: Obstetrics & Gynecology

## 2020-03-27 DIAGNOSIS — Z1231 Encounter for screening mammogram for malignant neoplasm of breast: Secondary | ICD-10-CM

## 2020-04-03 ENCOUNTER — Ambulatory Visit
Admission: RE | Admit: 2020-04-03 | Discharge: 2020-04-03 | Disposition: A | Discharge status: Home / Self Care | Payer: BC Managed Care – PPO | Source: Ambulatory Visit | Attending: Obstetrics & Gynecology | Admitting: Obstetrics & Gynecology

## 2020-04-03 ENCOUNTER — Other Ambulatory Visit: Payer: Self-pay

## 2020-04-03 DIAGNOSIS — Z1231 Encounter for screening mammogram for malignant neoplasm of breast: Secondary | ICD-10-CM

## 2020-09-23 IMAGING — MG DIGITAL SCREENING BILAT W/ TOMO W/ CAD
8 series · 9 of 24 positions shown · non-contrast
Comparison: Previous exam(s).

CLINICAL DATA: Screening.

EXAM:
DIGITAL SCREENING BILATERAL MAMMOGRAM WITH TOMO AND CAD

[R MLO synth-2D]
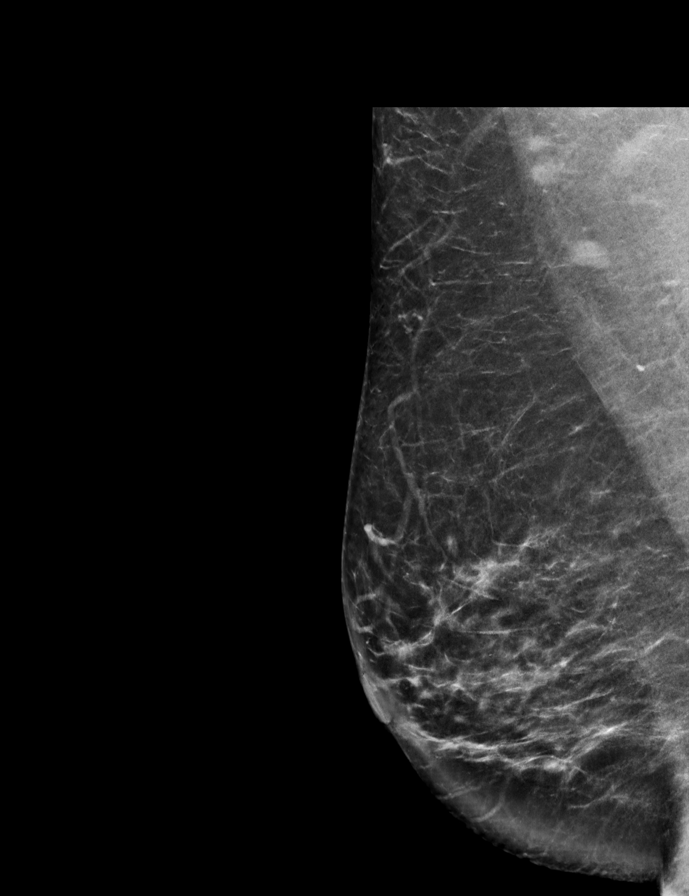

[L MLO synth-2D]
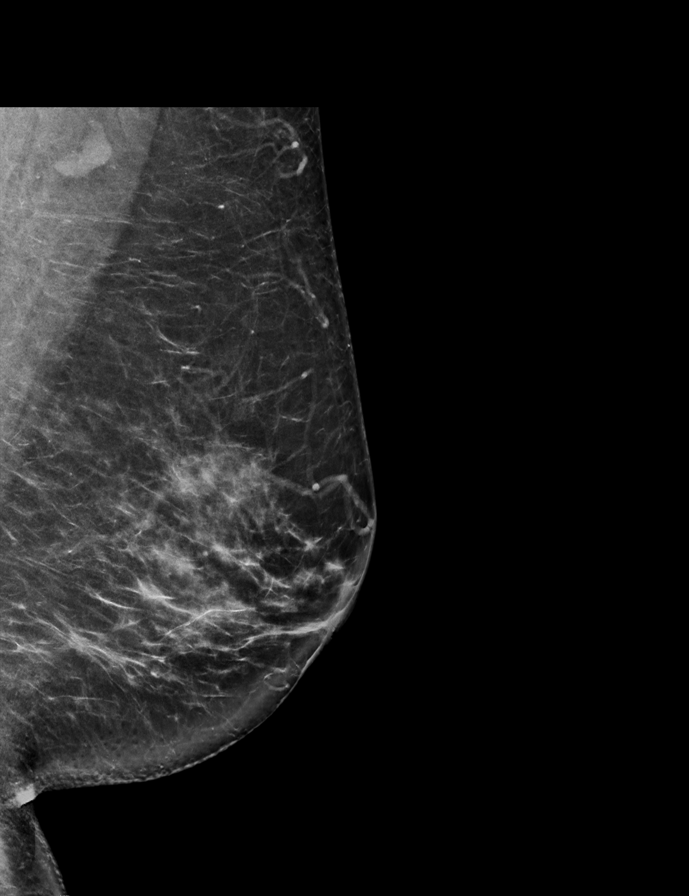

[L CC synth-2D]
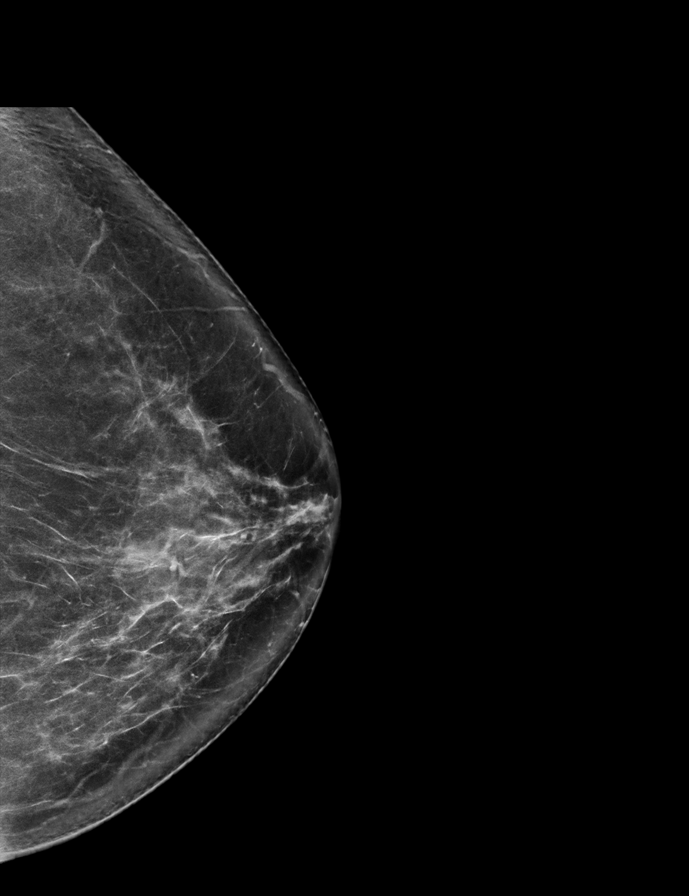

[R CC synth-2D]
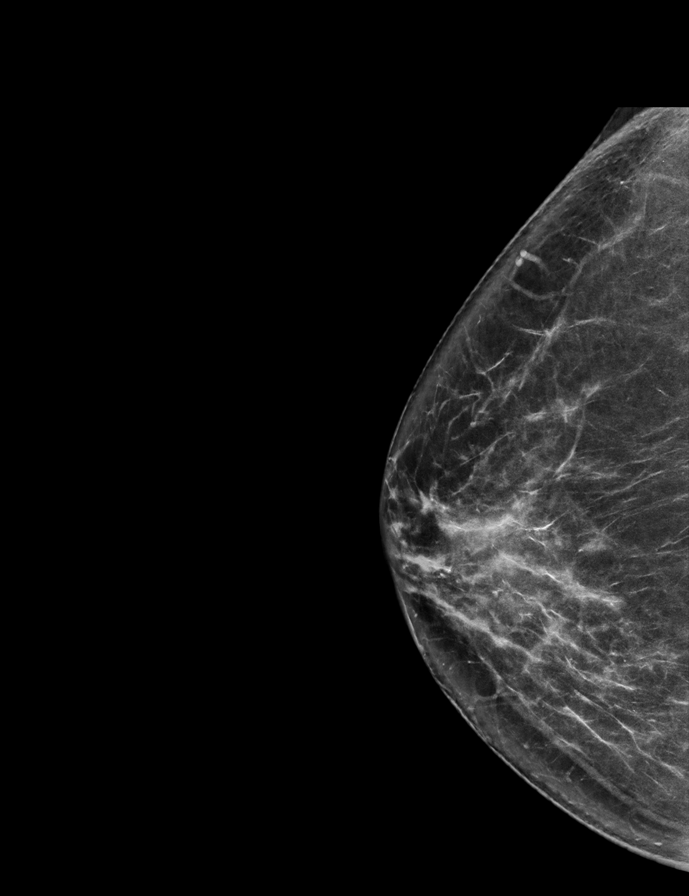

[L MLO tomo · 2 of 78 frames shown]
[frame 26/78]
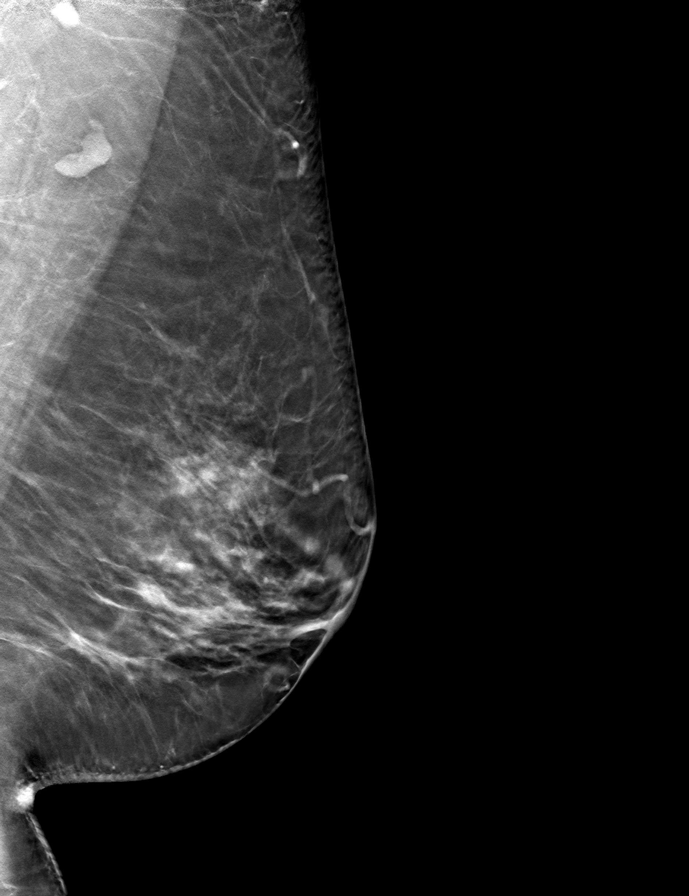
[frame 39/78]
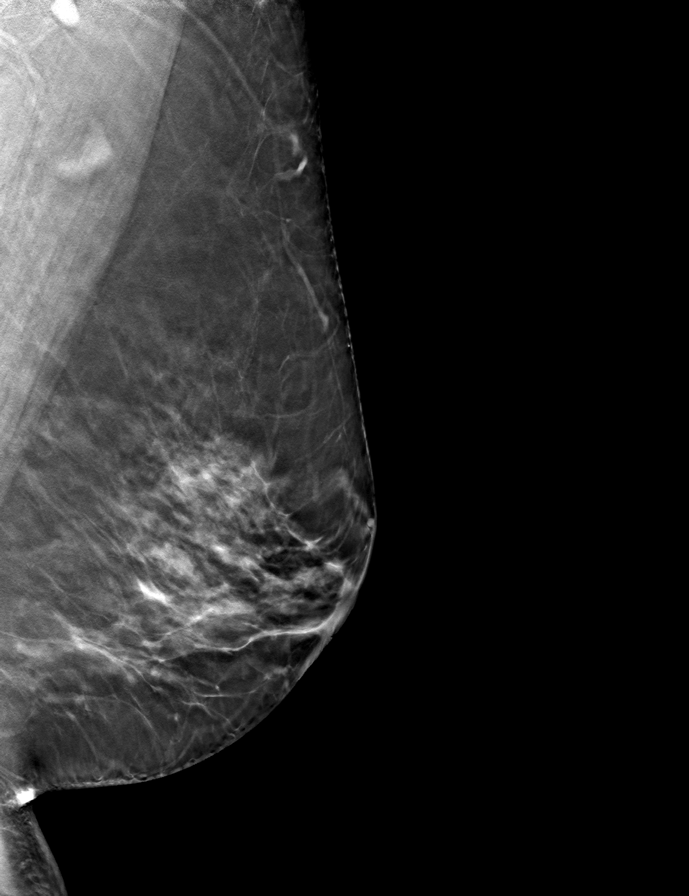

[R CC tomo · tomo slice 35/70.0]
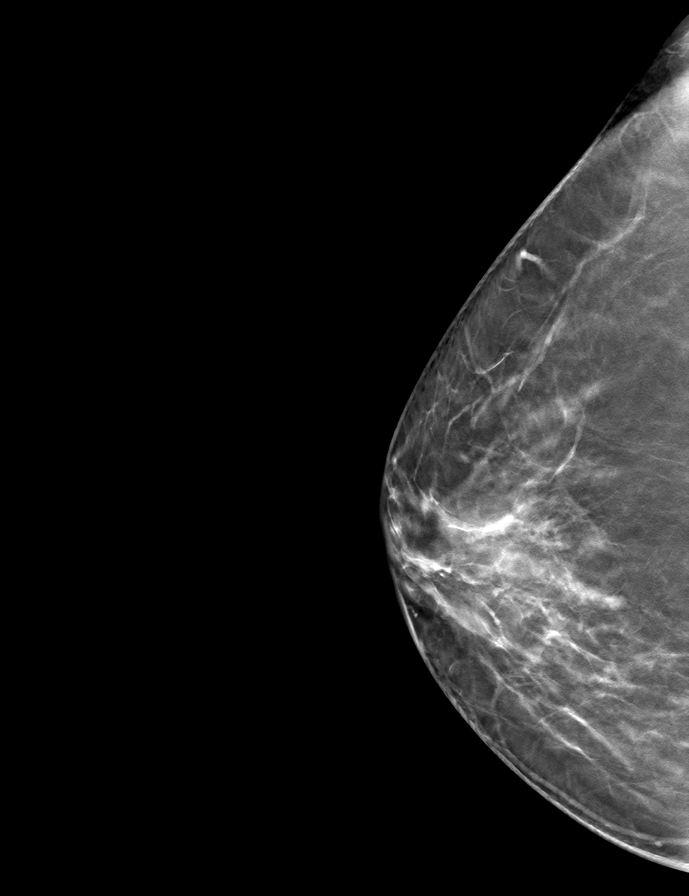

[L CC tomo · tomo slice 39/76.0]
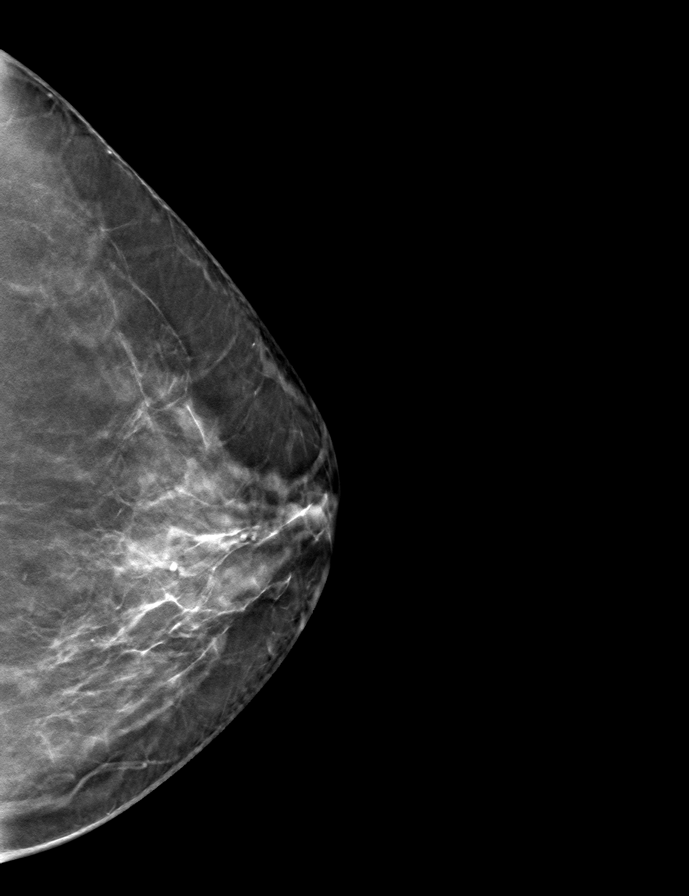

[R MLO tomo · tomo slice 41/82.0]
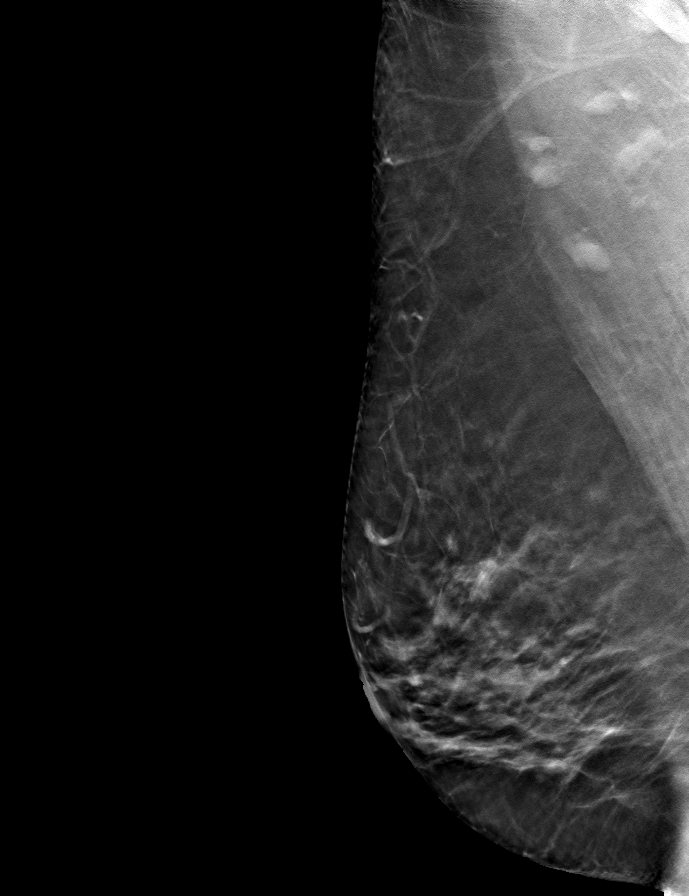

[9 of 24 positions shown; findings below may reference images not displayed]

ACR Breast Density Category c: The breast tissue is heterogeneously
dense, which may obscure small masses.
FINDINGS: There are no findings suspicious for malignancy. Images were
processed with CAD.
IMPRESSION: No mammographic evidence of malignancy. A result letter of this
screening mammogram will be mailed directly to the patient.

RECOMMENDATION:
Screening mammogram in one year. (Code:FT-U-LHB)

BI-RADS CATEGORY  1: Negative.

## 2020-11-30 ENCOUNTER — Ambulatory Visit: Payer: Self-pay

## 2021-03-15 ENCOUNTER — Other Ambulatory Visit: Payer: Self-pay | Admitting: Obstetrics & Gynecology

## 2021-03-15 DIAGNOSIS — Z1231 Encounter for screening mammogram for malignant neoplasm of breast: Secondary | ICD-10-CM
